# Patient Record
Sex: Female | Born: 2000 | Race: White | Hispanic: No | Marital: Single | Smoking: Never smoker
Health system: Southern US, Community
[De-identification: ages and names within clinical notes are randomized; demographics above are authoritative.]

## PROBLEM LIST (undated history)

## (undated) DIAGNOSIS — F329 Major depressive disorder, single episode, unspecified: Secondary | ICD-10-CM

## (undated) DIAGNOSIS — F909 Attention-deficit hyperactivity disorder, unspecified type: Secondary | ICD-10-CM

## (undated) DIAGNOSIS — F419 Anxiety disorder, unspecified: Secondary | ICD-10-CM

## (undated) DIAGNOSIS — F79 Unspecified intellectual disabilities: Secondary | ICD-10-CM

## (undated) DIAGNOSIS — F4310 Post-traumatic stress disorder, unspecified: Secondary | ICD-10-CM

## (undated) DIAGNOSIS — R569 Unspecified convulsions: Secondary | ICD-10-CM

## (undated) DIAGNOSIS — F32A Depression, unspecified: Secondary | ICD-10-CM

## (undated) DIAGNOSIS — R7303 Prediabetes: Secondary | ICD-10-CM

## (undated) DIAGNOSIS — I959 Hypotension, unspecified: Secondary | ICD-10-CM

---
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
## 2020-02-24 ENCOUNTER — Emergency Department (HOSPITAL_COMMUNITY)
Admission: EM | Admit: 2020-02-24 | Discharge: 2020-02-25 | Disposition: A | Discharge status: Home / Self Care | Payer: Medicaid Other | Attending: Emergency Medicine | Admitting: Emergency Medicine

## 2020-02-24 ENCOUNTER — Encounter (HOSPITAL_COMMUNITY): Payer: Self-pay

## 2020-02-24 ENCOUNTER — Other Ambulatory Visit: Payer: Self-pay

## 2020-02-24 DIAGNOSIS — R45851 Suicidal ideations: Secondary | ICD-10-CM | POA: Insufficient documentation

## 2020-02-24 DIAGNOSIS — F909 Attention-deficit hyperactivity disorder, unspecified type: Secondary | ICD-10-CM | POA: Diagnosis not present

## 2020-02-24 DIAGNOSIS — Z20822 Contact with and (suspected) exposure to covid-19: Secondary | ICD-10-CM | POA: Diagnosis not present

## 2020-02-24 DIAGNOSIS — F332 Major depressive disorder, recurrent severe without psychotic features: Secondary | ICD-10-CM | POA: Diagnosis present

## 2020-02-24 LAB — CBC WITH DIFFERENTIAL/PLATELET
Abs Immature Granulocytes: 0.02 10*3/uL (ref 0.00–0.07)
Basophils Absolute: 0 10*3/uL (ref 0.0–0.1)
Basophils Relative: 1 %
Eosinophils Absolute: 0 10*3/uL (ref 0.0–0.5)
Eosinophils Relative: 1 %
HCT: 39.1 % (ref 36.0–46.0)
Hemoglobin: 12.3 g/dL (ref 12.0–15.0)
Immature Granulocytes: 0 %
Lymphocytes Relative: 26 %
Lymphs Abs: 1.9 10*3/uL (ref 0.7–4.0)
MCH: 27 pg (ref 26.0–34.0)
MCHC: 31.5 g/dL (ref 30.0–36.0)
MCV: 85.9 fL (ref 80.0–100.0)
Monocytes Absolute: 0.4 10*3/uL (ref 0.1–1.0)
Monocytes Relative: 6 %
Neutro Abs: 5 10*3/uL (ref 1.7–7.7)
Neutrophils Relative %: 66 %
Platelets: 288 10*3/uL (ref 150–400)
RBC: 4.55 MIL/uL (ref 3.87–5.11)
RDW: 13.4 % (ref 11.5–15.5)
WBC: 7.5 10*3/uL (ref 4.0–10.5)
nRBC: 0 % (ref 0.0–0.2)

## 2020-02-24 LAB — COMPREHENSIVE METABOLIC PANEL
ALT: 16 U/L (ref 0–44)
AST: 19 U/L (ref 15–41)
Albumin: 4 g/dL (ref 3.5–5.0)
Alkaline Phosphatase: 88 U/L (ref 38–126)
Anion gap: 9 (ref 5–15)
BUN: 8 mg/dL (ref 6–20)
CO2: 25 mmol/L (ref 22–32)
Calcium: 8.6 mg/dL — ABNORMAL LOW (ref 8.9–10.3)
Chloride: 103 mmol/L (ref 98–111)
Creatinine, Ser: 0.76 mg/dL (ref 0.44–1.00)
GFR calc Af Amer: >60 mL/min (ref >60)
GFR calc non Af Amer: >60 mL/min (ref >60)
Glucose, Bld: 118 mg/dL — ABNORMAL HIGH (ref 70–99)
Potassium: 3.8 mmol/L (ref 3.5–5.1)
Sodium: 137 mmol/L (ref 135–145)
Total Bilirubin: 0.3 mg/dL (ref 0.3–1.2)
Total Protein: 7.5 g/dL (ref 6.5–8.1)

## 2020-02-24 LAB — RAPID URINE DRUG SCREEN, HOSP PERFORMED
Amphetamines: NOT DETECTED
Barbiturates: NOT DETECTED
Benzodiazepines: NOT DETECTED
Cocaine: NOT DETECTED
Opiates: NOT DETECTED
Tetrahydrocannabinol: NOT DETECTED

## 2020-02-24 LAB — ETHANOL: Alcohol, Ethyl (B): <10 mg/dL (ref <10)

## 2020-02-24 LAB — HCG, QUANTITATIVE, PREGNANCY: hCG, Beta Chain, Quant, S: <1 m[IU]/mL (ref <5)

## 2020-02-24 MED ORDER — ALBUTEROL SULFATE HFA 108 (90 BASE) MCG/ACT IN AERS: 1.0000 | INHALATION_SPRAY | Freq: Four times a day (QID) | RESPIRATORY_TRACT | Status: DC | PRN

## 2020-02-24 MED ORDER — SERTRALINE HCL 50 MG PO TABS
100.0000 mg | ORAL_TABLET | Freq: Every day | ORAL | Status: DC
  Administered 2020-02-24: 100 mg via ORAL
  Filled 2020-02-24: qty 2

## 2020-02-24 MED ORDER — HYDROXYZINE HCL 25 MG PO TABS
25.0000 mg | ORAL_TABLET | Freq: Once | ORAL | Status: AC
  Administered 2020-02-24: 25 mg via ORAL
  Filled 2020-02-24: qty 1

## 2020-02-24 MED ORDER — ARIPIPRAZOLE 5 MG PO TABS
5.0000 mg | ORAL_TABLET | Freq: Every day | ORAL | Status: DC
  Administered 2020-02-24: 5 mg via ORAL
  Filled 2020-02-24: qty 1

## 2020-02-24 NOTE — ED Notes (Signed)
Pt given gingerale, resting in bed

## 2020-02-24 NOTE — ED Provider Notes (Signed)
[facility] EMERGENCY DEPARTMENT Provider Note   CSN: [id number] Arrival date & time: 02/24/20  2018     History Chief Complaint  Patient presents with  . Suicidal    Mercedes Cardenas is a 19 y.o. female.  Patient was brought in because she has suicidal thoughts  The history is provided by the patient and medical records.  Altered Mental Status Presenting symptoms: behavior changes   Severity:  Moderate Most recent episode:  Today Episode history:  Multiple Timing:  Constant Progression:  Waxing and waning Chronicity:  New Context: not alcohol use   Associated symptoms: no abdominal pain, no hallucinations, no headaches, no rash and no seizures        Past Medical History:  Diagnosis Date  . ADHD   . Anxiety   . Depression   . Intellectual disability   . Major depressive disorder   . Pre-diabetes   . PTSD (post-traumatic stress disorder)     Patient Active Problem List   Diagnosis Date Noted  . Psychosis (HCC)   . Intellectual disability   . Post traumatic stress disorder (PTSD)   . MDD (major depressive disorder) 01/31/2019    History reviewed. No pertinent surgical history.   OB History   No obstetric history on file.     Family History  Problem Relation Age of Onset  . Diabetes Mother   . Hyperlipidemia Mother   . Bipolar disorder Father     Social History   Tobacco Use  . Smoking status: Never Smoker  . Smokeless tobacco: Never Used  Substance Use Topics  . Alcohol use: Never  . Drug use: Never    Home Medications Prior to Admission medications   Medication Sig Start Date End Date Taking? Authorizing Provider  albuterol (VENTOLIN HFA) 108 (90 Base) MCG/ACT inhaler Inhale 1-2 puffs into the lungs every 6 (six) hours as needed for wheezing or shortness of breath.    [provider]  ARIPiprazole (ABILIFY) 5 MG tablet Take 1 tablet (5 mg total) by mouth at bedtime. Patient taking differently: Take 5 mg by mouth in the morning.   02/06/19   Aldean Baker, NP  hydrOXYzine (ATARAX/VISTARIL) 25 MG tablet Take 1 tablet (25 mg total) by mouth 3 (three) times daily as needed for anxiety. Patient taking differently: Take 25 mg by mouth at bedtime.  02/06/19   Aldean Baker, NP  sertraline (ZOLOFT) 100 MG tablet Take 50-100 mg by mouth See admin instructions. 50mg  in the morning and take 100mg  at bedtime 07/11/19   [provider]    Allergies    Patient has no known allergies.  Review of Systems   Review of Systems  Constitutional: Negative for appetite change and fatigue.  HENT: Negative for congestion, ear discharge and sinus pressure.   Eyes: Negative for discharge.  Respiratory: Negative for cough.   Cardiovascular: Negative for chest pain.  Gastrointestinal: Negative for abdominal pain and diarrhea.  Genitourinary: Negative for frequency and hematuria.  Musculoskeletal: Negative for back pain.  Skin: Negative for rash.  Neurological: Negative for seizures and headaches.  Psychiatric/Behavioral: Negative for hallucinations.       Depression and suicidal thoughts    Physical Exam Updated Vital Signs BP (!) 159/122   Pulse 82   Temp 98.3 F (36.8 C) (Oral)   Resp 19   Ht 5\' 6"  (1.676 m)   Wt 90.7 kg   LMP 02/21/2020   SpO2 99%   BMI 32.28 kg/m   Physical  Exam Vitals and nursing note reviewed.  Constitutional:      Appearance: She is well-developed.  HENT:     Head: Normocephalic.     Nose: Nose normal.  Eyes:     General: No scleral icterus.    Conjunctiva/sclera: Conjunctivae normal.  Neck:     Thyroid: No thyromegaly.  Cardiovascular:     Rate and Rhythm: Normal rate and regular rhythm.     Heart sounds: No murmur heard.  No friction rub. No gallop.   Pulmonary:     Breath sounds: No stridor. No wheezing or rales.  Chest:     Chest wall: No tenderness.  Abdominal:     General: There is no distension.     Tenderness: There is no abdominal tenderness. There is no rebound.    Musculoskeletal:        General: Normal range of motion.     Cervical back: Neck supple.  Lymphadenopathy:     Cervical: No cervical adenopathy.  Skin:    Findings: No erythema or rash.  Neurological:     Mental Status: She is alert and oriented to person, place, and time.     Motor: No abnormal muscle tone.     Coordination: Coordination normal.  Psychiatric:        Behavior: Behavior normal.     Comments: Patient not suicidal now according to what he says     ED Results / Procedures / Treatments   Labs (all labs ordered are listed, but only abnormal results are displayed) Labs Reviewed  SARS CORONAVIRUS 2 BY RT PCR (HOSPITAL ORDER, PERFORMED IN [facility])  RAPID URINE DRUG SCREEN, HOSP PERFORMED  CBC WITH DIFFERENTIAL/PLATELET  COMPREHENSIVE METABOLIC PANEL  HCG, QUANTITATIVE, PREGNANCY  ETHANOL    EKG None  Radiology No results found.  Procedures Procedures (including critical care time)  Medications Ordered in ED Medications  sertraline (ZOLOFT) tablet 100 mg (has no administration in time range)  hydrOXYzine (ATARAX/VISTARIL) tablet 25 mg (has no administration in time range)    ED Course  I have reviewed the triage vital signs and the nursing notes.  Pertinent labs & imaging results that were available during my care of the patient were reviewed by me and considered in my medical decision making (see chart for details).    MDM Rules/Calculators/A&P                          Patient with suicidal ideations that possibly have diminished now.  She will be seen by behavioral health       This patient presents to the ED for concern of depression, this involves an extensive number of treatment options, and is a complaint that carries with it a high risk of complications and morbidity.  The differential diagnosis includes suicidal ideation   Lab Tests:   I Ordered, reviewed, and interpreted labs, which included CBC and chemistries  unremarkable  Medicines ordered:   I ordered medication Zoloft Atarax  Imaging Studies ordered:     Additional history obtained:   Additional history obtained from record  Previous records obtained and reviewed.  Consultations Obtained:   I consulted you.  And discussed lab and imaging findings  Reevaluation:  After the interventions stated above, I reevaluated the patient and found unchanged  Critical Interventions:  .   Final Clinical Impression(s) / ED Diagnoses Final diagnoses:  None    Rx / DC Orders ED Discharge Orders  None       Bethann Berkshire, MD 02/26/20 1145

## 2020-02-24 NOTE — ED Triage Notes (Signed)
Pt comes in voluntarily with escort of [employer]. With feelings of being suicidal. Says she has been feeling this way for a while. States that her plan is to just run away and "be gone" called police to bring her because she did not want to worry family.

## 2020-02-24 NOTE — ED Provider Notes (Signed)
11:39 PM Ford, TTS states patient has been accepted at behavioral health by Dr.Maurer, pending her Covid test results.  Nursing staff is getting that done now.  Patient is in her stretcher in the hall.  She was informed of her admission and she is agreeable.   Devoria Albe, MD 02/24/20 [phone]
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
## 2020-02-25 ENCOUNTER — Inpatient Hospital Stay (HOSPITAL_COMMUNITY)
Admission: EM | Admit: 2020-02-25 | Discharge: 2020-02-28 | DRG: 885 | Disposition: A | Discharge status: Home / Self Care | Payer: Medicaid Other | Source: Other Acute Inpatient Hospital | Attending: Psychiatry | Admitting: Psychiatry

## 2020-02-25 ENCOUNTER — Encounter (HOSPITAL_COMMUNITY): Payer: Self-pay

## 2020-02-25 DIAGNOSIS — F4310 Post-traumatic stress disorder, unspecified: Secondary | ICD-10-CM | POA: Diagnosis present

## 2020-02-25 DIAGNOSIS — X788XXA Intentional self-harm by other sharp object, initial encounter: Secondary | ICD-10-CM | POA: Diagnosis present

## 2020-02-25 DIAGNOSIS — K5900 Constipation, unspecified: Secondary | ICD-10-CM | POA: Diagnosis present

## 2020-02-25 DIAGNOSIS — G4700 Insomnia, unspecified: Secondary | ICD-10-CM | POA: Diagnosis present

## 2020-02-25 DIAGNOSIS — F603 Borderline personality disorder: Secondary | ICD-10-CM | POA: Diagnosis present

## 2020-02-25 DIAGNOSIS — Z915 Personal history of self-harm: Secondary | ICD-10-CM | POA: Diagnosis not present

## 2020-02-25 DIAGNOSIS — F79 Unspecified intellectual disabilities: Secondary | ICD-10-CM

## 2020-02-25 DIAGNOSIS — F411 Generalized anxiety disorder: Secondary | ICD-10-CM | POA: Diagnosis present

## 2020-02-25 DIAGNOSIS — F322 Major depressive disorder, single episode, severe without psychotic features: Secondary | ICD-10-CM | POA: Insufficient documentation

## 2020-02-25 DIAGNOSIS — F333 Major depressive disorder, recurrent, severe with psychotic symptoms: Principal | ICD-10-CM | POA: Diagnosis present

## 2020-02-25 DIAGNOSIS — Z20822 Contact with and (suspected) exposure to covid-19: Secondary | ICD-10-CM | POA: Diagnosis present

## 2020-02-25 DIAGNOSIS — R45851 Suicidal ideations: Secondary | ICD-10-CM | POA: Diagnosis present

## 2020-02-25 HISTORY — DX: Hypotension, unspecified: I95.9

## 2020-02-25 HISTORY — DX: Unspecified convulsions: R56.9

## 2020-02-25 LAB — SARS CORONAVIRUS 2 BY RT PCR (HOSPITAL ORDER, PERFORMED IN ~~LOC~~ HOSPITAL LAB): SARS Coronavirus 2: NEGATIVE

## 2020-02-25 MED ORDER — HYDROXYZINE HCL 25 MG PO TABS
25.0000 mg | ORAL_TABLET | Freq: Every day | ORAL | Status: DC
  Administered 2020-02-25 – 2020-02-27 (×3): 25 mg via ORAL
  Filled 2020-02-25 (×6): qty 1

## 2020-02-25 MED ORDER — ARIPIPRAZOLE 5 MG PO TABS
5.0000 mg | ORAL_TABLET | Freq: Every morning | ORAL | Status: DC
  Administered 2020-02-26 – 2020-02-28 (×3): 5 mg via ORAL
  Filled 2020-02-25 (×4): qty 1

## 2020-02-25 MED ORDER — POLYETHYLENE GLYCOL 3350 17 G PO PACK
17.0000 g | PACK | Freq: Every day | ORAL | Status: DC
  Administered 2020-02-25 – 2020-02-27 (×3): 17 g via ORAL
  Filled 2020-02-25 (×5): qty 1

## 2020-02-25 MED ORDER — MAGNESIUM HYDROXIDE 400 MG/5ML PO SUSP
30.0000 mL | Freq: Every day | ORAL | Status: DC | PRN
  Administered 2020-02-25: 30 mL via ORAL
  Filled 2020-02-25: qty 30

## 2020-02-25 MED ORDER — HYDROXYZINE HCL 25 MG PO TABS
25.0000 mg | ORAL_TABLET | Freq: Four times a day (QID) | ORAL | Status: DC | PRN
  Administered 2020-02-25 – 2020-02-28 (×3): 25 mg via ORAL
  Filled 2020-02-25 (×2): qty 1

## 2020-02-25 MED ORDER — ACETAMINOPHEN 325 MG PO TABS: 650.0000 mg | ORAL_TABLET | Freq: Four times a day (QID) | ORAL | Status: DC | PRN

## 2020-02-25 MED ORDER — TRAZODONE HCL 50 MG PO TABS
50.0000 mg | ORAL_TABLET | Freq: Every evening | ORAL | Status: DC | PRN
  Administered 2020-02-25 – 2020-02-27 (×3): 50 mg via ORAL
  Filled 2020-02-25 (×4): qty 1

## 2020-02-25 MED ORDER — ACETAMINOPHEN 325 MG PO TABS
650.0000 mg | ORAL_TABLET | Freq: Four times a day (QID) | ORAL | Status: DC | PRN
  Administered 2020-02-27: 650 mg via ORAL
  Filled 2020-02-25: qty 2

## 2020-02-25 MED ORDER — SERTRALINE HCL 100 MG PO TABS
200.0000 mg | ORAL_TABLET | Freq: Once | ORAL | Status: AC
  Administered 2020-02-25: 200 mg via ORAL
  Filled 2020-02-25: qty 2

## 2020-02-25 MED ORDER — ALUM & MAG HYDROXIDE-SIMETH 200-200-20 MG/5ML PO SUSP
30.0000 mL | ORAL | Status: DC | PRN
  Administered 2020-02-26: 30 mL via ORAL
  Filled 2020-02-25: qty 30

## 2020-02-25 MED ORDER — ARIPIPRAZOLE 5 MG PO TABS
5.0000 mg | ORAL_TABLET | Freq: Once | ORAL | Status: AC
  Administered 2020-02-25: 5 mg via ORAL
  Filled 2020-02-25: qty 1

## 2020-02-25 MED ORDER — ALBUTEROL SULFATE HFA 108 (90 BASE) MCG/ACT IN AERS: 1.0000 | INHALATION_SPRAY | Freq: Four times a day (QID) | RESPIRATORY_TRACT | Status: DC | PRN

## 2020-02-25 MED ORDER — SERTRALINE HCL 100 MG PO TABS
200.0000 mg | ORAL_TABLET | Freq: Every day | ORAL | Status: DC
  Administered 2020-02-26 – 2020-02-27 (×2): 200 mg via ORAL
  Filled 2020-02-25 (×4): qty 2
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
## 2020-02-25 NOTE — Progress Notes (Signed)
   02/25/20 0612  Vital Signs  Temp 98.5 F (36.9 C)  Temp Source Oral  Pulse Rate 84  BP 106/78  BP Method Automatic   D: Patient denies SI/HI/AVH.Patient rated anxiety 3/10 and depression 4/10. Patient slept in, in the morning, but went to the cafeteria at lunch. Patient has some light red lacerations on her upper right arm from cutting. Pt. Stated that she "..did it yesterday." A:   Support and encouragement provided Routine safety checks conducted every 15 minutes. Patient  Informed to notify staff with any concerns.   R:  Safety maintained.

## 2020-02-25 NOTE — BHH Group Notes (Signed)
Date:  02/25/2020 Time:  9:00am-9:45am  Group Topic/Focus: PROGRESSIVE RELAXATION. A group where deep breathing is taught and tensing and relaxation muscle groups is used. Imagery is used as well.  Pts are asked to imagine 3 pillars that hold them up when they are not able to hold themselves up.  Participation Level:  Active  Participation Quality:  Appropriate  Affect:  Appropriate  Cognitive:  Oriented  Insight: Improving  Engagement in Group:  Engaged  Modes of Intervention:  Activity, Discussion, Education, and Support  Additional Comments:  Pt rates her energy at a 4/10 and states her pillars are her sister, nephew and nieces, and God.  Dione Housekeeper 02/25/2020

## 2020-02-25 NOTE — Tx Team (Signed)
Initial Treatment Plan 02/25/2020 4:29 AM [name] VZS:[id number]    PATIENT STRESSORS: Financial difficulties Health problems Marital or family conflict Occupational concerns   PATIENT STRENGTHS: Active sense of humor Motivation for treatment/growth Supportive family/friends   PATIENT IDENTIFIED PROBLEMS: depression  anxiety  Anger/frustration when interacting with 19 year old autistic nephew  Financial stress, hours cut back at her job at [employer]   Stressful job  Self harm behaviors: cutting arm with scissors           DISCHARGE CRITERIA:  Adequate post-discharge living arrangements Improved stabilization in mood, thinking, and/or behavior Medical problems require only outpatient monitoring Verbal commitment to aftercare and medication compliance  PRELIMINARY DISCHARGE PLAN: Outpatient therapy Return to previous living arrangement Return to previous work or school arrangements  PATIENT/FAMILY INVOLVEMENT: This treatment plan has been presented to and reviewed with the patient, Mercedes Cardenas.  The patient and family have been given the opportunity to ask questions and make suggestions.  Ephraim Hamburger, RN 02/25/2020, 4:29 AM
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
## 2020-02-25 NOTE — Progress Notes (Signed)
   02/25/20 2050  Psych Admission Type (Psych Patients Only)  Admission Status Voluntary  Psychosocial Assessment  Patient Complaints Anxiety;Depression  Eye Contact Fair  Facial Expression Flat  Affect Anxious;Appropriate to circumstance  Speech Logical/coherent  Interaction Assertive;Childlike  Motor Activity Other (Comment) (wnl)  Appearance/Hygiene Unremarkable  Behavior Characteristics Cooperative  Mood Depressed;Anxious;Pleasant  Thought Process  Coherency WDL  Content Blaming self  Delusions None reported or observed  Perception WDL  Hallucination None reported or observed  Judgment Poor  Confusion None  Danger to Self  Current suicidal ideation? Denies  Danger to Others  Danger to Others None reported or observed   Pt came to nurse's station because she wanted to talk. Pt stated that she had a dream about being home and everything being alright. "The thing is that when I dream about something it usually comes true like it's a sign to me or something." Pt states that she wants to work on her frustration and anxiety. She lives with her sister who has a 75 year old son who is autistic. The child will bite and hit at times and pt doesn't know how to handle it well. Pt blames herself. She says that her sister will kick her out if she doesn't learn to get along better with the child. Pt wants to learn more about autism and how to handle a child with it. She often babysits him and her 3 nieces who belong to her other sister. Emotional support provided to pt. Also agreed to print off information that she could read about children and autism.  Pt rates anxiety 1/10 and depression 2/10.

## 2020-02-25 NOTE — Progress Notes (Signed)
Date:  02/25/2020 Time:  8:49 PM   Group Topic/Focus:  Goals Group:   The focus of this group is to help patients, review daily goals to achieve during treatment, and discuss how the patient can incorporate goal setting into their daily lives to aide in recovery. This group also aides in reassessing group goals and modifying them in case the goal set was not a good fit.   Participation Level:  Active   Participation Quality:  Appropriate   Affect:  Appropriate   Cognitive:  Appropriate   Insight: Appropriate   Engagement in Group:  Engaged   Modes of Intervention:  Discussion   Additional Comments:  Patient attended goal review and wrap up in Recovery Group and participated. Patient rated today a 4.   Ivry Pigue Foxx 02/25/2020, 8:30 PM
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
## 2020-02-26 LAB — TSH: TSH: 2.84 u[IU]/mL (ref 0.350–4.500)

## 2020-02-26 LAB — LIPID PANEL
Cholesterol: 228 mg/dL — ABNORMAL HIGH (ref 0–200)
HDL: 40 mg/dL — ABNORMAL LOW (ref >40)
LDL Cholesterol: 157 mg/dL — ABNORMAL HIGH (ref 0–99)
Total CHOL/HDL Ratio: 5.7 RATIO
Triglycerides: 157 mg/dL — ABNORMAL HIGH (ref <150)
VLDL: 31 mg/dL (ref 0–40)

## 2020-02-26 NOTE — Progress Notes (Signed)
ADULT GRIEF GROUP NOTE:  Spiritual care group on grief and loss facilitated by chaplain Burnis Kingfisher  Group Goal:  Support / Education around grief and loss Members engage in facilitated group support and psycho-social education.  Group Description:  Following introductions and group rules, group members engaged in facilitated group dialog and support around topic of loss, with particular support around experiences of loss in their lives. Group Identified types of loss (relationships / self / things) and identified patterns, circumstances, and changes that precipitate losses. Reflected on thoughts / feelings around loss, normalized grief responses, and recognized variety in grief experience. Patient Progress:   Arrived at group late.  Was greeted by another group member.  Did not engage in group

## 2020-02-26 NOTE — [facility] Suicide Risk Assessment (Signed)
[facility] INPATIENT:  Family/Significant Other Suicide Prevention Education  Suicide Prevention Education:  Contact Attempts: Johnie Stadel, sister [phone] has been identified by the patient as the family member/significant other with whom the patient will be residing, and identified as the person(s) who will aid the patient in the event of a mental health crisis.  With written consent from the patient, two attempts were made to provide suicide prevention education, prior to and/or following the patient's discharge.  We were unsuccessful in providing suicide prevention education.  A suicide education pamphlet was given to the patient to share with family/significant other.  Date and time of first attempt: 02/26/20 1032am CSW left confidential vm for Ms. Bahr, awaiting response Date and time of second attempt: 2nd attempt needed  Montey Ebel T Zane Samson 02/26/2020, 10:35 AM

## 2020-02-26 NOTE — Progress Notes (Signed)
Recreation Therapy Notes  Date: 8.23.21 Time: 0930 Location: 300 Hall Group Room  Group Topic: Stress Management  Goal Area(s) Addresses:  Patient will identify positive stress management techniques. Patient will identify benefits of using stress management post d/c.  Behavioral Response:  Engaged  Intervention: Stress Management  Activity:  Meditation.  LRT played a meditation that focused on being resilient in the face of adversity.  Patients were to listen and follow along as meditation played.    Education:  Stress Management, Discharge Planning.   Education Outcome: Acknowledges Education  Clinical Observations/Feedback: Pt attended and participated in activity.    Caroll Rancher, LRT/CTRS    Caroll Rancher A 02/26/2020 11:42 AM
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
## 2020-02-26 NOTE — [facility] Group Notes (Signed)
Occupational Therapy Group Note Date: 02/26/2020 Group Topic/Focus: Stages of Change  Group Description: Group encouraged increased engagement and participation through discussion focused on Stages of Change. Education provided on the five stages of stage including pre-contemplation, contemplation, preparation, action, maintenance, and relapse. Patients were encouraged to identify one behavior they would like to change as it relates to their current hospitalization (mental health and/or substance use) and identify and talk through which stage they are currently in and what steps they need to take to get to the next stage of change.  Participation Level: Active   Participation Quality: Independent   Behavior: Alert, Appropriate, Cooperative and Interactive   Speech/Thought Process: Focused   Affect/Mood: Full range   Insight: Fair   Judgement: Fair   Individualization: Mercedes Cardenas was active and independent in her participation of discussion/activity. She shared that she would like to change her behaviors that occur as the result of her anger and anxiety, noting this was the reason for her hospital admission. Pt talked through the stages of change and identified being in stage of 'preparation' and identified her current efforts to come up with discharge plan and identify strategies to better manage her anger and anxiety without becoming rude with sister. Pt appeared somewhat receptive to feedback and education received and identified "listening to music" and "going to the gym" as helpful coping strategies.   Modes of Intervention: Activity, Discussion, Education and Problem-solving  Patient Response to Interventions:  Attentive, Engaged, Receptive and Interested   Plan: Continue to engage patient in OT groups 2 - 3x/week.  02/26/2020  Donne Hazel, MOT, OTR/L

## 2020-02-26 NOTE — Progress Notes (Signed)
   02/26/20 2140  Psych Admission Type (Psych Patients Only)  Admission Status Involuntary  Psychosocial Assessment  Patient Complaints Anxiety;Depression  Eye Contact Fair  Facial Expression Flat  Affect Anxious  Speech Logical/coherent;Slow  Interaction Assertive;Childlike  Motor Activity Other (Comment) (wnl)  Appearance/Hygiene Unremarkable  Behavior Characteristics Cooperative  Mood Pleasant;Anxious;Depressed  Thought Process  Coherency WDL  Content Blaming self  Delusions None reported or observed  Perception WDL  Hallucination None reported or observed  Judgment Poor  Confusion None  Danger to Self  Current suicidal ideation? Denies  Danger to Others  Danger to Others None reported or observed   Pt states that she had a call from her sister today. Her sister says pt can't live with her anymore and will be moving in with her great grandmother. "I have lived with her before but she doesn't always treat me right." Pt was anxious and panicked after that. Pt rates anxiety 3/10 and depression 7/10. Pt also reported stomach upset (indigestion). Emotional support provided to pt.

## 2020-02-26 NOTE — Tx Team (Signed)
Interdisciplinary Treatment and Diagnostic Plan Update  02/26/2020 Time of Session: 10:00am Mercedes Cardenas MRN: [id number]  Principal Diagnosis: MDD (major depressive disorder), recurrent, severe, with psychosis ([place])  Secondary Diagnoses: Principal Problem:   MDD (major depressive disorder), recurrent, severe, with psychosis ([place]) Active Problems:   Intellectual disability   Post traumatic stress disorder (PTSD)   Constipation   Anxiety state   Current Medications:  Current Facility-Administered Medications  Medication Dose Route Frequency Provider Last Rate Last Admin  . acetaminophen (TYLENOL) tablet 650 mg  650 mg Oral Q6H PRN Lavella Hammock, MD      . albuterol (VENTOLIN HFA) 108 (90 Base) MCG/ACT inhaler 1-2 puff  1-2 puff Inhalation Q6H PRN Lavella Hammock, MD      . alum & mag hydroxide-simeth (MAALOX/MYLANTA) 200-200-20 MG/5ML suspension 30 mL  30 mL Oral Q4H PRN Caroline Sauger, NP      . ARIPiprazole (ABILIFY) tablet 5 mg  5 mg Oral q AM Lavella Hammock, MD   5 mg at 02/26/20 1308  . hydrOXYzine (ATARAX/VISTARIL) tablet 25 mg  25 mg Oral Q6H PRN Caroline Sauger, NP   25 mg at 02/25/20 6578  . hydrOXYzine (ATARAX/VISTARIL) tablet 25 mg  25 mg Oral QHS Lavella Hammock, MD   25 mg at 02/25/20 2130  . magnesium hydroxide (MILK OF MAGNESIA) suspension 30 mL  30 mL Oral Daily PRN Caroline Sauger, NP   30 mL at 02/25/20 2134  . polyethylene glycol (MIRALAX / GLYCOLAX) packet 17 g  17 g Oral Daily Dagar, Anjali, MD   17 g at 02/26/20 0927  . sertraline (ZOLOFT) tablet 200 mg  200 mg Oral QHS Dagar, Anjali, MD      . traZODone (DESYREL) tablet 50 mg  50 mg Oral QHS PRN Caroline Sauger, NP   50 mg at 02/25/20 2130   PTA Medications: Medications Prior to Admission  Medication Sig Dispense Refill Last Dose  . acetaminophen (TYLENOL) 325 MG tablet Take 650 mg by mouth every 6 (six) hours as needed for mild pain or headache.     . albuterol (VENTOLIN HFA) 108  (90 Base) MCG/ACT inhaler Inhale 1-2 puffs into the lungs every 6 (six) hours as needed for wheezing or shortness of breath.     . ARIPiprazole (ABILIFY) 5 MG tablet Take 1 tablet (5 mg total) by mouth at bedtime. (Patient taking differently: Take 5 mg by mouth in the morning. ) 30 tablet 0   . hydrOXYzine (ATARAX/VISTARIL) 25 MG tablet Take 1 tablet (25 mg total) by mouth 3 (three) times daily as needed for anxiety. (Patient taking differently: Take 25 mg by mouth at bedtime. ) 30 tablet 0   . sertraline (ZOLOFT) 100 MG tablet Take 50-100 mg by mouth See admin instructions. 21m in the morning and take 1047mat bedtime       Patient Stressors: Financial difficulties Health problems Marital or family conflict Occupational concerns  Patient Strengths: Active sense of humor Motivation for treatment/growth Supportive family/friends  Treatment Modalities: Medication Management, Group therapy, Case management,  1 to 1 session with clinician, Psychoeducation, Recreational therapy.   Physician Treatment Plan for Primary Diagnosis: MDD (major depressive disorder), recurrent, severe, with psychosis [place]): Improvement in symptoms so as ready for discharge Improvement in symptoms so as ready for discharge   Short Term Goals: Ability to identify changes in lifestyle to reduce recurrence of condition will improve Ability to verbalize feelings will improve Ability to disclose and discuss suicidal  ideas Ability to demonstrate self-control will improve Ability to identify and develop effective coping behaviors will improve Ability to maintain clinical measurements within normal limits will improve Ability to identify changes in lifestyle to reduce recurrence of condition will improve Ability to verbalize feelings will improve Ability to disclose and discuss suicidal ideas Ability to demonstrate self-control will improve Ability to identify and develop effective coping behaviors  will improve Ability to maintain clinical measurements within normal limits will improve  Medication Management: Evaluate patient's response, side effects, and tolerance of medication regimen.  Therapeutic Interventions: 1 to 1 sessions, Unit Group sessions and Medication administration.  Evaluation of Outcomes: Not Met  Physician Treatment Plan for Secondary Diagnosis: Principal Problem:   MDD (major depressive disorder), recurrent, severe, with psychosis ([place]) Active Problems:   Intellectual disability   Post traumatic stress disorder (PTSD)   Constipation   Anxiety state  Long Term Goal(s): Improvement in symptoms so as ready for discharge Improvement in symptoms so as ready for discharge   Short Term Goals: Ability to identify changes in lifestyle to reduce recurrence of condition will improve Ability to verbalize feelings will improve Ability to disclose and discuss suicidal ideas Ability to demonstrate self-control will improve Ability to identify and develop effective coping behaviors will improve Ability to maintain clinical measurements within normal limits will improve Ability to identify changes in lifestyle to reduce recurrence of condition will improve Ability to verbalize feelings will improve Ability to disclose and discuss suicidal ideas Ability to demonstrate self-control will improve Ability to identify and develop effective coping behaviors will improve Ability to maintain clinical measurements within normal limits will improve     Medication Management: Evaluate patient's response, side effects, and tolerance of medication regimen.  Therapeutic Interventions: 1 to 1 sessions, Unit Group sessions and Medication administration.  Evaluation of Outcomes: Not Met   RN Treatment Plan for Primary Diagnosis: MDD (major depressive disorder), recurrent, severe, with psychosis ([place]) Long Term Goal(s): Knowledge of disease and therapeutic regimen to maintain health  will improve  Short Term Goals: Ability to remain free from injury will improve, Ability to verbalize frustration and anger appropriately will improve, Ability to identify and develop effective coping behaviors will improve and Compliance with prescribed medications will improve  Medication Management: RN will administer medications as ordered by provider, will assess and evaluate patient's response and provide education to patient for prescribed medication. RN will report any adverse and/or side effects to prescribing provider.  Therapeutic Interventions: 1 on 1 counseling sessions, Psychoeducation, Medication administration, Evaluate responses to treatment, Monitor vital signs and CBGs as ordered, Perform/monitor CIWA, COWS, AIMS and Fall Risk screenings as ordered, Perform wound care treatments as ordered.  Evaluation of Outcomes: Not Met   LCSW Treatment Plan for Primary Diagnosis: MDD (major depressive disorder), recurrent, severe, with psychosis ([place]) Long Term Goal(s): Safe transition to appropriate next level of care at discharge, Engage patient in therapeutic group addressing interpersonal concerns.  Short Term Goals: Engage patient in aftercare planning with referrals and resources, Increase social support, Identify triggers associated with mental health/substance abuse issues and Increase skills for wellness and recovery  Therapeutic Interventions: Assess for all discharge needs, 1 to 1 time with Social worker, Explore available resources and support systems, Assess for adequacy in community support network, Educate family and significant other(s) on suicide prevention, Complete Psychosocial Assessment, Interpersonal group therapy.  Evaluation of Outcomes: Not Met   Progress in Treatment: Attending groups: Yes. Participating in groups: Yes. Taking medication as prescribed: Yes.  Toleration medication: Yes. Family/Significant other contact made: No, will contact:  sister. Patient  understands diagnosis: Yes. Discussing patient identified problems/goals with staff: Yes. Medical problems stabilized or resolved: Yes. Denies suicidal/homicidal ideation: Yes. Issues/concerns per patient self-inventory: No.  New problem(s) identified: No, Describe:  none.  New Short Term/Long Term Goal(s): medication stabilization, elimination of SI thoughts, development of comprehensive mental wellness plan.   Patient Goals:  "Anger, depression, and anxiety fixed"   Discharge Plan or Barriers: To continue therapy and medication management with established providers  Reason for Continuation of Hospitalization: Anxiety Depression Medication stabilization Suicidal ideation  Estimated Length of Stay: 3-5 days  Attendees: Patient: Mercedes Cardenas  02/26/2020   Physician: Myles Lipps, MD 02/26/2020   Nursing:  02/26/2020   RN Care Manager: 02/26/2020   Social Worker: Darletta Moll, [place] 02/26/2020   Recreational Therapist:  02/26/2020   Other:  02/26/2020   Other:  02/26/2020   Other: 02/26/2020       Scribe for Treatment Team: Vassie Moselle, LCSW 02/26/2020 10:37 AM

## 2020-02-27 LAB — HEMOGLOBIN A1C
Hgb A1c MFr Bld: 6.1 % — ABNORMAL HIGH (ref 4.8–5.6)
Mean Plasma Glucose: 128 mg/dL

## 2020-02-27 MED ORDER — NICOTINE POLACRILEX 2 MG MT GUM
2.0000 mg | CHEWING_GUM | OROMUCOSAL | Status: DC | PRN
  Administered 2020-02-27 (×2): 2 mg via ORAL
  Filled 2020-02-27: qty 1

## 2020-02-27 NOTE — Progress Notes (Signed)
Patient noted in milieu and day room smiling laughing and socializing with peers. Denies any SI, HI, AVH but endorses depression. Patient eating snacks, watching movies and laughing with peers. Pt able to make needs known.  Encouragement and support provided. Medications given as prescribed. Prn given for sleep. Waiting effectiveness. Patient receptive and remains safe on unit with q 15 min checks.

## 2020-02-27 NOTE — Progress Notes (Signed)
D:  Patient's self inventory sheet, patient has fair sleep, sleep medication helpful.  Fair appetite, low energy level, poor concentration.  Rated depression and anxiety 5, hopeless 1.  Denied withdrawals.  Denied SI.  Physical problems, stomach, worst pain #7 in past 24 hours, head, stomach.  Goal is work on feelings.  No discharge plans. A:  Medications administered per MD orders.  Emotional support and encouragement given patient. R:  Patient denied SI and HI, contracts for safety.  Denied A/V hallucinations.  Denied pain.  Safety maintained with 15 minute checks.

## 2020-02-27 NOTE — [facility] Suicide Risk Assessment (Signed)
[facility] INPATIENT:  Family/Significant Other Suicide Prevention Education  Suicide Prevention Education:  Education Completed; Mercedes Cardenas (Sister)  [phone]   (name of family member/significant other) has been identified by the patient as the family member/significant other with whom the patient will be residing, and identified as the person(s) who will aid the patient in the event of a mental health crisis (suicidal ideations/suicide attempt).  With written consent from the patient, the family member/significant other has been provided the following suicide prevention education, prior to the and/or following the discharge of the patient.  The suicide prevention education provided includes the following:  Suicide risk factors  Suicide prevention and interventions  National Suicide Hotline telephone number  [facility] assessment telephone number  [facility] Emergency Assistance 911  [facility] and/or Residential Mobile Crisis Unit telephone number  Request made of family/significant other to:  Remove weapons (e.g., guns, rifles, knives), all items previously/currently identified as safety concern.    Remove drugs/medications (over-the-counter, prescriptions, illicit drugs), all items previously/currently identified as a safety concern.  The family member/significant other verbalizes understanding of the suicide prevention education information provided.  The family member/significant other agrees to remove the items of safety concern listed above.  Sister explained plan for pt to go to Grandmothers house in [place] area post-discharge. Sister also explained that she would be willing to have pt return to live with her if needed. Sister provides grandmother's [place] phone number [phone]. States that grandmother will likely pick up pt upon discharge.   Tamber Burtch P Adhrit Krenz 02/27/2020, 1:24 PM

## 2020-02-27 NOTE — Plan of Care (Signed)
Nurse discussed anxiety, depression and coping skills with patient.  

## 2020-02-27 NOTE — Progress Notes (Signed)
Patient rated her day as a 6 out of 10 since she was feeling sad due to some family issues. Her goal for tomorrow is to be better organized and to journal.

## 2020-02-27 NOTE — Progress Notes (Signed)
Recreation Therapy Notes  Animal-Assisted Activity (AAA) Program Checklist/Progress Notes Patient Eligibility Criteria Checklist & Daily Group note for Rec Tx Intervention  Date: 8.24.21 Time: 1430 Location: 300 Hall Group Room   AAA/T Program Assumption of Risk Form signed by Patient/ or Parent Legal Guardian  YES  Patient is free of allergies or sever asthma  YES   Patient reports no fear of animals YES   Patient reports no history of cruelty to animals  YES  Patient understands his/her participation is voluntary YES  Patient washes hands before animal contact  YES   Patient washes hands after animal contact  YES   Behavioral Response:  Engaged  Education: Charity fundraiser, Appropriate Animal Interaction   Education Outcome: Acknowledges understanding/In group clarification offered/Needs additional education.   Clinical Observations/Feedback:  Pt attended and participated in activity.   Caroll Rancher, LRT/CTRS         Caroll Rancher A 02/27/2020 3:32 PM

## 2020-02-27 NOTE — Plan of Care (Signed)
  Problem: Education: Goal: Emotional status will improve Outcome: Progressing Goal: Mental status will improve Outcome: Progressing Goal: Verbalization of understanding the information provided will improve Outcome: Progressing   Problem: Activity: Goal: Interest or engagement in activities will improve Outcome: Progressing   Problem: Safety: Goal: Periods of time without injury will increase Outcome: Progressing   

## 2020-02-27 NOTE — [facility] Suicide Risk Assessment (Signed)
[facility] INPATIENT:  Family/Significant Other Suicide Prevention Education  Suicide Prevention Education:  Education Completed; Oretha Ellis [phone],  (name of family member/significant other) has been identified by the patient as the family member/significant other with whom the patient will be residing, and identified as the person(s) who will aid the patient in the event of a mental health crisis (suicidal ideations/suicide attempt).  With written consent from the patient, the family member/significant other has been provided the following suicide prevention education, prior to the and/or following the discharge of the patient.  The suicide prevention education provided includes the following:  Suicide risk factors  Suicide prevention and interventions  National Suicide Hotline telephone number  [facility] assessment telephone number  [facility] Emergency Assistance 911  [facility] and/or Residential Mobile Crisis Unit telephone number  Request made of family/significant other to:  Remove weapons (e.g., guns, rifles, knives), all items previously/currently identified as safety concern.    Remove drugs/medications (over-the-counter, prescriptions, illicit drugs), all items previously/currently identified as a safety concern.  The family member/significant other verbalizes understanding of the suicide prevention education information provided.  The family member/significant other agrees to remove the items of safety concern listed above.  Grandmother confirmed pt would be living with grandma. She explained that pt's brother [place] would need to pick her up upon discharge and then gather her belongings (including bed) from [place]. [place]'s phone number is [phone]. Will need to schedule ahead of time if possible.   Erin Sons 02/27/2020, 2:19 PM

## 2020-02-28 MED ORDER — POLYETHYLENE GLYCOL 3350 17 G PO PACK
17.0000 g | PACK | Freq: Every day | ORAL | 0 refills | Status: AC
Start: 2020-02-29 — End: ?

## 2020-02-28 MED ORDER — TRAZODONE HCL 50 MG PO TABS
50.0000 mg | ORAL_TABLET | Freq: Every evening | ORAL | 0 refills | Status: AC | PRN
Start: 2020-02-28 — End: ?

## 2020-02-28 MED ORDER — HYDROXYZINE HCL 25 MG PO TABS
25.0000 mg | ORAL_TABLET | Freq: Four times a day (QID) | ORAL | 0 refills | Status: AC | PRN
Start: 2020-02-28 — End: ?

## 2020-02-28 MED ORDER — SERTRALINE HCL 100 MG PO TABS
200.0000 mg | ORAL_TABLET | Freq: Every day | ORAL | 0 refills | Status: AC
Start: 2020-02-28 — End: ?

## 2020-02-28 MED ORDER — ARIPIPRAZOLE 5 MG PO TABS: 5.0000 mg | ORAL_TABLET | Freq: Every morning | ORAL | 0 refills | Status: AC

## 2020-02-28 MED ORDER — NICOTINE POLACRILEX 2 MG MT GUM
2.0000 mg | CHEWING_GUM | OROMUCOSAL | 0 refills | Status: AC | PRN
Start: 2020-02-28 — End: ?

## 2020-02-28 NOTE — [facility] Suicide Risk Assessment (Signed)
[facility] Discharge Suicide Risk Assessment   Principal Problem: MDD (major depressive disorder), recurrent, severe, with psychosis ([facility]) Discharge Diagnoses: Principal Problem:   MDD (major depressive disorder), recurrent, severe, with psychosis ([facility]) Active Problems:   Intellectual disability   Post traumatic stress disorder (PTSD)   Constipation   Anxiety state   Total Time spent with patient: 15 minutes  Musculoskeletal: Strength & Muscle Tone: within normal limits Gait & Station: normal Patient leans: N/A  Psychiatric Specialty Exam: Review of Systems  All other systems reviewed and are negative.   Blood pressure 116/82, pulse 98, temperature 98.3 F (36.8 C), temperature source Oral, resp. rate 18, height 5\' 5"  (1.651 m), weight 98.9 kg, last menstrual period 02/21/2020, SpO2 98 %.Body mass index is 36.28 kg/m.  General Appearance: Casual  Eye Contact::  Fair  Speech:  Normal Rate409  Volume:  Decreased  Mood:  Euthymic  Affect:  Constricted  Thought Process:  Goal Directed and Descriptions of Associations: Circumstantial  Orientation:  Full (Time, Place, and Person)  Thought Content:  Negative  Suicidal Thoughts:  No  Homicidal Thoughts:  No  Memory:  Immediate;   Fair Recent;   Fair Remote;   Fair  Judgement:  Intact  Insight:  Lacking  Psychomotor Activity:  Normal  Concentration:  Fair  Recall:  [phone] of Knowledge:Fair  Language: Fair  Akathisia:  Negative  Handed:  Right  AIMS (if indicated):     Assets:  Desire for Improvement Resilience Social Support  Sleep:  Number of Hours: 6.25  Cognition: Impaired,  Mild  ADL's:  Intact   Mental Status Per Nursing Assessment::   On Admission:  Suicidal ideation indicated by patient  Demographic Factors:  Adolescent or young adult, Caucasian, Low socioeconomic status and Unemployed  Loss Factors: NA  Historical Factors: Impulsivity  Risk Reduction Factors:   Living with another person, especially  a relative and Positive social support  Continued Clinical Symptoms:  Depression:   Impulsivity  Cognitive Features That Contribute To Risk:  None    Suicide Risk:  Minimal: No identifiable suicidal ideation.  Patients presenting with no risk factors but with morbid ruminations; may be classified as minimal risk based on the severity of the depressive symptoms   Follow-up Information    [facility]. Go on 03/04/2020.   Why: You have an in person appointment on Monday 03/04/20 at 915am with Dr. 03/06/20. You will then have a virtual therapy appointment with Webb Silversmith at 11am.  Contact information: [address] [place] [phone]              Plan Of Care/Follow-up recommendations:  Activity:  ad lib  [phone], MD 02/28/2020, 9:27 AM
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
## 2020-02-28 NOTE — Progress Notes (Signed)
Discharge Note:  Patient denies SI/HI AVH at this time. Discharge instructions, AVS, prescriptions and transition record gone over with patient. Patient agrees to comply with medication management, follow-up visit, and outpatient therapy. Patient belongings returned to patient. Patient questions and concerns addressed and answered.  Patient ambulatory off unit.  Patient discharged.  

## 2020-03-20 ENCOUNTER — Ambulatory Visit
Admission: EM | Admit: 2020-03-20 | Discharge: 2020-03-20 | Disposition: A | Discharge status: Home / Self Care | Payer: Medicaid Other | Attending: Emergency Medicine | Admitting: Emergency Medicine

## 2020-03-20 ENCOUNTER — Other Ambulatory Visit: Payer: Self-pay

## 2020-03-20 DIAGNOSIS — K029 Dental caries, unspecified: Secondary | ICD-10-CM

## 2020-03-20 MED ORDER — AMOXICILLIN-POT CLAVULANATE 875-125 MG PO TABS: 1.0000 | ORAL_TABLET | Freq: Two times a day (BID) | ORAL | 0 refills | Status: DC

## 2020-03-20 MED ORDER — CHLORHEXIDINE GLUCONATE 0.12 % MT SOLN: 15.0000 mL | Freq: Two times a day (BID) | OROMUCOSAL | 0 refills | Status: AC

## 2020-03-20 NOTE — ED Triage Notes (Signed)
Pt presents with ear ache and sore throat for past 4 days

## 2020-03-20 NOTE — Discharge Instructions (Addendum)
Rest and drink plenty of fluids Prescribed Augmentin Chlorhexidine mouthwash was prescribed/take as directed Take medications as directed and to completion Continue to use OTC ibuprofen and/ or tylenol as needed for pain control Follow up with PCP if symptoms persists Return here or go to the ER if you have any new or worsening symptoms

## 2020-03-20 NOTE — ED Provider Notes (Signed)
[facility]   [id number] 03/20/20 Arrival Time: 1243  Chief Complaint  Patient presents with  . Otalgia     SUBJECTIVE:  History from: patient.  Mercedes Cardenas is a 19 y.o. female who presents to the urgent care with a complaint of earache and tooth ache for the past 4 days.  Denies a precipitating event.  Patient states the pain is constant and achy in character.  Patient has tried OTC medication without relief.  Symptoms are made worse with eating.  Does not see a dentist regularly.  Report similar symptoms in the past.  Denies fever, chills, fatigue, sinus pain, rhinorrhea, ear discharge, sore throat, SOB, wheezing, chest pain, nausea, changes in bowel or bladder habits.    ROS: As per HPI.  All other pertinent ROS negative.      Past Medical History:  Diagnosis Date  . ADHD   . Anxiety   . Depression   . Hypotension   . Intellectual disability   . Major depressive disorder   . Pre-diabetes   . PTSD (post-traumatic stress disorder)   . PTSD (post-traumatic stress disorder)   . Seizures (HCC)    History reviewed. No pertinent surgical history. No Known Allergies No current facility-administered medications on file prior to encounter.   Current Outpatient Medications on File Prior to Encounter  Medication Sig Dispense Refill  . albuterol (VENTOLIN HFA) 108 (90 Base) MCG/ACT inhaler Inhale 1-2 puffs into the lungs every 6 (six) hours as needed for wheezing or shortness of breath.    . ARIPiprazole (ABILIFY) 5 MG tablet Take 1 tablet (5 mg total) by mouth in the morning. For mood control 30 tablet 0  . hydrOXYzine (ATARAX/VISTARIL) 25 MG tablet Take 1 tablet (25 mg total) by mouth every 6 (six) hours as needed. For anxiety/sleep 75 tablet 0  . nicotine polacrilex (NICORETTE) 2 MG gum Take 1 each (2 mg total) by mouth as needed. (May buy from over the the counter): For smoking cessation 100 tablet 0  . polyethylene glycol (MIRALAX / GLYCOLAX) 17 g packet Take 17  g by mouth daily. (May buy from over the counter): For constipation 14 each 0  . sertraline (ZOLOFT) 100 MG tablet Take 2 tablets (200 mg total) by mouth at bedtime. For depression 60 tablet 0  . traZODone (DESYREL) 50 MG tablet Take 1 tablet (50 mg total) by mouth at bedtime as needed for sleep. 30 tablet 0   Social History   Socioeconomic History  . Marital status: Single    Spouse name: Not on file  . Number of children: Not on file  . Years of education: Not on file  . Highest education level: Not on file  Occupational History  . Not on file  Tobacco Use  . Smoking status: Never Smoker  . Smokeless tobacco: Never Used  Vaping Use  . Vaping Use: Never used  Substance and Sexual Activity  . Alcohol use: Never  . Drug use: Never  . Sexual activity: Not Currently  Other Topics Concern  . Not on file  Social History Narrative  . Not on file   Social Determinants of Health   Financial Resource Strain:   . Difficulty of Paying Living Expenses: Not on file  Food Insecurity:   . Worried About Programme researcher, broadcasting/film/video in the Last Year: Not on file  . Ran Out of Food in the Last Year: Not on file  Transportation Needs:   . Lack of Transportation (Medical): Not on  file  . Lack of Transportation (Non-Medical): Not on file  Physical Activity:   . Days of Exercise per Week: Not on file  . Minutes of Exercise per Session: Not on file  Stress:   . Feeling of Stress : Not on file  Social Connections:   . Frequency of Communication with Friends and Family: Not on file  . Frequency of Social Gatherings with Friends and Family: Not on file  . Attends Religious Services: Not on file  . Active Member of Clubs or Organizations: Not on file  . Attends Banker Meetings: Not on file  . Marital Status: Not on file  Intimate Partner Violence:   . Fear of Current or Ex-Partner: Not on file  . Emotionally Abused: Not on file  . Physically Abused: Not on file  . Sexually Abused: Not  on file   Family History  Problem Relation Age of Onset  . Diabetes Mother   . Hyperlipidemia Mother   . Bipolar disorder Father     OBJECTIVE:  Vitals:   03/20/20 1340  BP: 108/74  Pulse: 81  Resp: 16  Temp: 98.6 F (37 C)  SpO2: 99%     Physical Exam Vitals and nursing note reviewed.  Constitutional:      General: She is not in acute distress.    Appearance: Normal appearance. She is normal weight. She is not ill-appearing, toxic-appearing or diaphoretic.  HENT:     Head: Normocephalic.     Right Ear: Tympanic membrane, ear canal and external ear normal. There is no impacted cerumen.     Left Ear: Tympanic membrane, ear canal and external ear normal. There is no impacted cerumen.     Mouth/Throat:     Lips: Pink.     Dentition: Abnormal dentition. Dental caries present.      Comments: Dental caries present Cardiovascular:     Rate and Rhythm: Normal rate and regular rhythm.     Pulses: Normal pulses.     Heart sounds: Normal heart sounds. No murmur heard.  No friction rub. No gallop.   Pulmonary:     Effort: Pulmonary effort is normal. No respiratory distress.     Breath sounds: Normal breath sounds. No stridor. No wheezing, rhonchi or rales.  Chest:     Chest wall: No tenderness.  Neurological:     Mental Status: She is alert and oriented to person, place, and time.    Imaging: No results found.   ASSESSMENT & PLAN:  1. Dental caries     Meds ordered this encounter  Medications  . amoxicillin-clavulanate (AUGMENTIN) 875-125 MG tablet    Sig: Take 1 tablet by mouth every 12 (twelve) hours.    Dispense:  14 tablet    Refill:  0  . chlorhexidine (PERIDEX) 0.12 % solution    Sig: Use as directed 15 mLs in the mouth or throat 2 (two) times daily.    Dispense:  120 mL    Refill:  0   Discharge instructions  Rest and drink plenty of fluids Prescribed Augmentin Chlorhexidine mouthwash was prescribed/take as directed Take medications as directed and  to completion Continue to use OTC ibuprofen and/ or tylenol as needed for pain control Follow up with PCP if symptoms persists Return here or go to the ER if you have any new or worsening symptoms   Reviewed expectations re: course of current medical issues. Questions answered. Outlined signs and symptoms indicating need for more acute intervention. Patient verbalized  understanding. After Visit Summary given.      Note: This document was prepared using Dragon voice recognition software and may include unintentional dictation errors.    Durward Parcel, FNP 03/20/20 1408

## 2020-10-08 ENCOUNTER — Ambulatory Visit
Admission: EM | Admit: 2020-10-08 | Discharge: 2020-10-08 | Disposition: A | Discharge status: Home / Self Care | Payer: Medicaid Other | Attending: Internal Medicine | Admitting: Internal Medicine

## 2020-10-08 ENCOUNTER — Encounter: Payer: Self-pay | Admitting: Emergency Medicine

## 2020-10-08 ENCOUNTER — Other Ambulatory Visit: Payer: Self-pay

## 2020-10-08 ENCOUNTER — Telehealth: Payer: Self-pay | Admitting: Internal Medicine

## 2020-10-08 DIAGNOSIS — R739 Hyperglycemia, unspecified: Secondary | ICD-10-CM

## 2020-10-08 DIAGNOSIS — K0889 Other specified disorders of teeth and supporting structures: Secondary | ICD-10-CM | POA: Diagnosis not present

## 2020-10-08 DIAGNOSIS — J0100 Acute maxillary sinusitis, unspecified: Secondary | ICD-10-CM

## 2020-10-08 DIAGNOSIS — R399 Unspecified symptoms and signs involving the genitourinary system: Secondary | ICD-10-CM | POA: Diagnosis not present

## 2020-10-08 LAB — POCT URINALYSIS DIP (MANUAL ENTRY)
Bilirubin, UA: NEGATIVE
Blood, UA: NEGATIVE
Glucose, UA: NEGATIVE mg/dL
Ketones, POC UA: NEGATIVE mg/dL
Leukocytes, UA: NEGATIVE
Nitrite, UA: NEGATIVE
Protein Ur, POC: 30 mg/dL — AB
Spec Grav, UA: >=1.03 — AB (ref 1.010–1.025)
Urobilinogen, UA: 1 E.U./dL
pH, UA: 6 (ref 5.0–8.0)

## 2020-10-08 LAB — POCT FASTING CBG KUC MANUAL ENTRY: POCT Glucose (KUC): 183 mg/dL — AB (ref 70–99)

## 2020-10-08 MED ORDER — FLUCONAZOLE 150 MG PO TABS: 150.0000 mg | ORAL_TABLET | Freq: Every day | ORAL | 0 refills | Status: AC

## 2020-10-08 MED ORDER — FLUTICASONE PROPIONATE 50 MCG/ACT NA SUSP: 2.0000 | Freq: Every day | NASAL | 0 refills | Status: AC

## 2020-10-08 MED ORDER — AMOXICILLIN-POT CLAVULANATE 875-125 MG PO TABS: 1.0000 | ORAL_TABLET | Freq: Two times a day (BID) | ORAL | 0 refills | Status: AC

## 2020-10-08 NOTE — ED Triage Notes (Signed)
Sinus pain and ear pain x 4days . Now reports toothache on RT lower side.

## 2020-10-08 NOTE — ED Provider Notes (Signed)
[facility]    CSN: [id number] Arrival date & time: 10/08/20  0820      History   Chief Complaint No chief complaint on file.   HPI Mercedes Cardenas is a 20 y.o. female who presents with her older sister with onset of stuffy nose and HA 4 days, allergy type symptoms but not been taking any meds. Has not been taking any antihistamines for current symptoms. Has not had a fever since was not tested or palpated by her sister whom she is staying with temporarely, but pt has been chilling and having body aches. Has mild cough. Appetite is a little less than her normal. Denies sinus surgeries in the past. She is prone to ear infections. Her sister did an at home covid test and was negative.   2- R lower tooth pain on molar region since yesterday wjhich has a cavity. Sister is planning dental care soon.     Past Medical History:  Diagnosis Date  . ADHD   . Anxiety   . Depression   . Hypotension   . Intellectual disability   . Major depressive disorder   . Pre-diabetes   . PTSD (post-traumatic stress disorder)   . PTSD (post-traumatic stress disorder)   . Seizures [facility])     Patient Active Problem List   Diagnosis Date Noted  . MDD (major depressive disorder), severe (HCC) 02/25/2020  . MDD (major depressive disorder), recurrent, severe, with psychosis (HCC) 02/25/2020  . Constipation 02/25/2020  . Anxiety state 02/25/2020  . Psychosis (HCC)   . Intellectual disability   . Post traumatic stress disorder (PTSD)   . MDD (major depressive disorder) 01/31/2019    History reviewed. No pertinent surgical history.  OB History   No obstetric history on file.      Home Medications    Prior to Admission medications   Medication Sig Start Date End Date Taking? Authorizing Provider  amoxicillin-clavulanate (AUGMENTIN) 875-125 MG tablet Take 1 tablet by mouth every 12 (twelve) hours. 10/08/20  Yes Rodriguez-Southworth, Nettie Elm, PA-C  fluticasone (FLONASE) 50 MCG/ACT nasal  spray Place 2 sprays into both nostrils daily. 10/08/20  Yes Rodriguez-Southworth, Nettie Elm, PA-C  albuterol (VENTOLIN HFA) 108 (90 Base) MCG/ACT inhaler Inhale 1-2 puffs into the lungs every 6 (six) hours as needed for wheezing or shortness of breath.    [provider]  ARIPiprazole (ABILIFY) 5 MG tablet Take 1 tablet (5 mg total) by mouth in the morning. For mood control 02/29/20   Armandina Stammer I, NP  chlorhexidine (PERIDEX) 0.12 % solution Use as directed 15 mLs in the mouth or throat 2 (two) times daily. 03/20/20   Avegno, Zachery Dakins, FNP  fluconazole (DIFLUCAN) 150 MG tablet Take 1 tablet (150 mg total) by mouth daily. Take one after antibiotic use if yeast symptoms, may repeat in 3 days if not better 10/08/20   Rodriguez-Southworth, Nettie Elm, PA-C  hydrOXYzine (ATARAX/VISTARIL) 25 MG tablet Take 1 tablet (25 mg total) by mouth every 6 (six) hours as needed. For anxiety/sleep 02/28/20   Armandina Stammer I, NP  nicotine polacrilex (NICORETTE) 2 MG gum Take 1 each (2 mg total) by mouth as needed. (May buy from over the the counter): For smoking cessation 02/28/20   Armandina Stammer I, NP  polyethylene glycol (MIRALAX / GLYCOLAX) 17 g packet Take 17 g by mouth daily. (May buy from over the counter): For constipation 02/29/20   Armandina Stammer I, NP  sertraline (ZOLOFT) 100 MG tablet Take 2 tablets (200 mg total)  by mouth at bedtime. For depression 02/28/20   Armandina Stammer I, NP  traZODone (DESYREL) 50 MG tablet Take 1 tablet (50 mg total) by mouth at bedtime as needed for sleep. 02/28/20   Sanjuana Kava, NP    Family History Family History  Problem Relation Age of Onset  . Diabetes Mother   . Hyperlipidemia Mother   . Bipolar disorder Father     Social History Social History   Tobacco Use  . Smoking status: Never Smoker  . Smokeless tobacco: Never Used  Vaping Use  . Vaping Use: Never used  Substance Use Topics  . Alcohol use: Never  . Drug use: Never     Allergies   Patient has no known  allergies.   Review of Systems Review of Systems  Constitutional: Positive for activity change, appetite change, chills and fatigue. Negative for diaphoresis and fever.       Has been lethargic  HENT: Positive for congestion, dental problem, ear discharge, sinus pressure and sneezing. Negative for ear pain, sore throat and trouble swallowing.        R>L  Eyes: Negative for discharge.  Respiratory: Positive for cough. Negative for chest tightness.   Cardiovascular: Negative for chest pain.  Gastrointestinal: Positive for diarrhea. Negative for abdominal pain, nausea and vomiting.       Had watery stools x 2 last week   Endocrine:       She is prediabetic and is on Metformin, but is not compliant with diet  Genitourinary: Negative for difficulty urinating, dysuria and frequency.       Had UTI last week and sister is not sure it resolved.   Musculoskeletal: Negative for gait problem and myalgias.  Skin: Negative for rash.  Neurological: Negative for headaches.  Hematological: Positive for adenopathy.     Physical Exam Triage Vital Signs ED Triage Vitals [10/08/20 0835]  Enc Vitals Group     BP 119/77     Pulse Rate (!) 119     Resp 19     Temp 97.7 F (36.5 C)     Temp Source Oral     SpO2 96 %     Weight      Height      Head Circumference      Peak Flow      Pain Score 8     Pain Loc      Pain Edu?      Excl. in GC?    No data found.  Updated Vital Signs BP 119/77 (BP Location: Right Arm)   Pulse (!) 119   Temp 97.7 F (36.5 C) (Oral)   Resp 19   SpO2 96%   Visual Acuity Right Eye Distance:   Left Eye Distance:   Bilateral Distance:    Right Eye Near:   Left Eye Near:    Bilateral Near:     Physical Exam Vitals and nursing note reviewed.  Constitutional:      General: She is not in acute distress.    Appearance: She is obese. She is not toxic-appearing.  HENT:     Head: Normocephalic.     Right Ear: Tympanic membrane, ear canal and external ear  normal.     Left Ear: Tympanic membrane, ear canal and external ear normal.     Nose: Congestion present.     Comments: Has tenderness on L maxillary and R frontal sinus. Has decreased transillumination on R maxillary sinus    Mouth/Throat:  Mouth: Mucous membranes are moist.     Comments: Has a cavity on R lower molar where is tender to tapping, but gum is not red or has signs of abscess Eyes:     General: No scleral icterus.    Conjunctiva/sclera: Conjunctivae normal.  Cardiovascular:     Rate and Rhythm: Regular rhythm. Tachycardia present.  Pulmonary:     Effort: Pulmonary effort is normal.  Musculoskeletal:        General: Normal range of motion.     Cervical back: Neck supple.  Lymphadenopathy:     Cervical: No cervical adenopathy.  Skin:    General: Skin is warm and dry.     Findings: No rash.  Neurological:     Mental Status: She is alert and oriented to person, place, and time.     Gait: Gait normal.     Comments: Speech is slow and answers questions with few seconds delay  Psychiatric:        Mood and Affect: Mood normal.        Behavior: Behavior normal.        Thought Content: Thought content normal.        Judgment: Judgment normal.     UC Treatments / Results  Labs (all labs ordered are listed, but only abnormal results are displayed) Labs Reviewed  POCT URINALYSIS DIP (MANUAL ENTRY) - Abnormal; Notable for the following components:      Result Value   Clarity, UA hazy (*)    Spec Grav, UA >=1.030 (*)    Protein Ur, POC =30 (*)    All other components within normal limits  POCT FASTING CBG [facility] MANUAL ENTRY - Abnormal; Notable for the following components:   POCT Glucose ([facility]) 183 (*)    All other components within normal limits    EKG   Radiology No results found.  Procedures Procedures (including critical care time)  Medications Ordered in UC Medications - No data to display  Initial Impression / Assessment and Plan / UC Course  I have  reviewed the triage vital signs and the nursing notes. Pertinent labs results that were available during my care of the patient were reviewed by me and considered in my medical decision making (see chart for details). Has maxillary sinusitis and resolved UTI. Also has hyperglycemia( ate 1h ago) May also be getting dental infection. I placed her on Augmentin as noted. Needs to Fu with PCP and dentist as planned by her sister.    Final Clinical Impressions(s) / UC Diagnoses   Final diagnoses:  Acute non-recurrent maxillary sinusitis  Pain, dental   Discharge Instructions   None    ED Prescriptions    Medication Sig Dispense Auth. Provider   amoxicillin-clavulanate (AUGMENTIN) 875-125 MG tablet Take 1 tablet by mouth every 12 (twelve) hours. 14 tablet Rodriguez-Southworth, Britany Callicott, PA-C   fluticasone (FLONASE) 50 MCG/ACT nasal spray Place 2 sprays into both nostrils daily. 18.2 g Rodriguez-Southworth, Nettie Elm, PA-C     PDMP not reviewed this encounter.   Garey Ham, PA-C 10/08/20 1503

## 2020-10-08 NOTE — Telephone Encounter (Signed)
I realized after writing her notes, I forgo to send diflucan in case she gets yeast

## 2021-08-21 IMAGING — CT CT CERVICAL SPINE W/O CM
3 of 4 series · 13 of 33 positions shown, 16 images · non-contrast
Comparison: None.

CLINICAL DATA: C-spine trauma.

EXAM:
CT CERVICAL SPINE WITHOUT CONTRAST
TECHNIQUE: Multidetector CT imaging of the cervical spine was performed without
intravenous contrast. Multiplanar CT image reconstructions were also
generated.

[Series 4: sagittal bone · sagittal · 0.27mm/px · 5 of 66 slices shown, 6 images]
[im 22/66  bone]
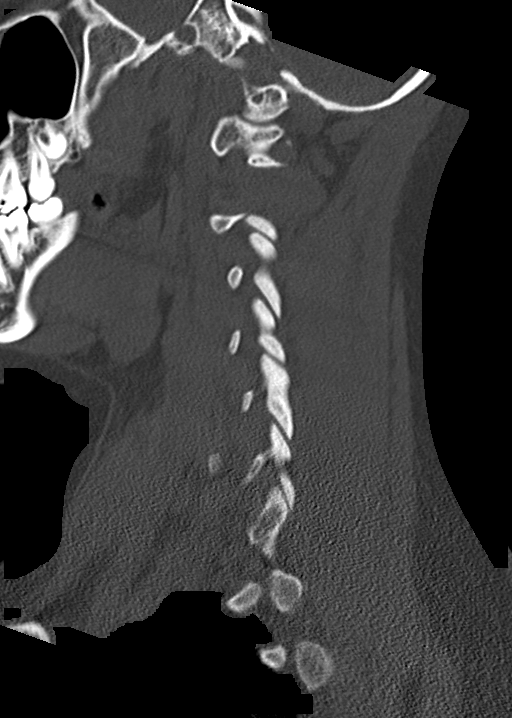
[im 28/66  bone]
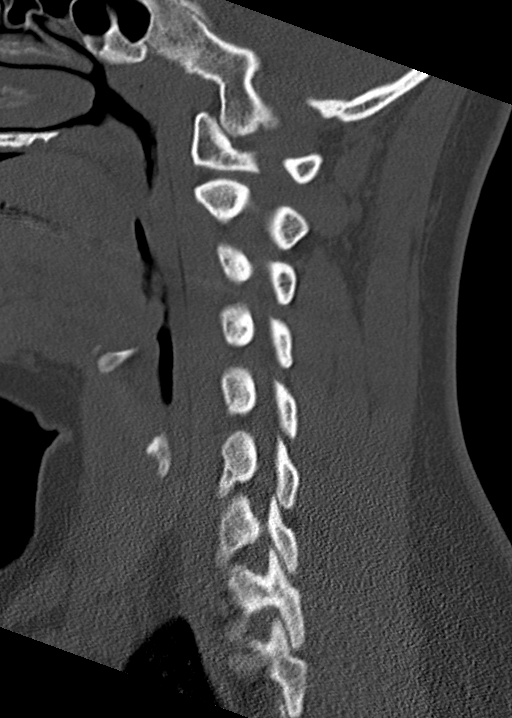
[im 33/66  soft-tissue]
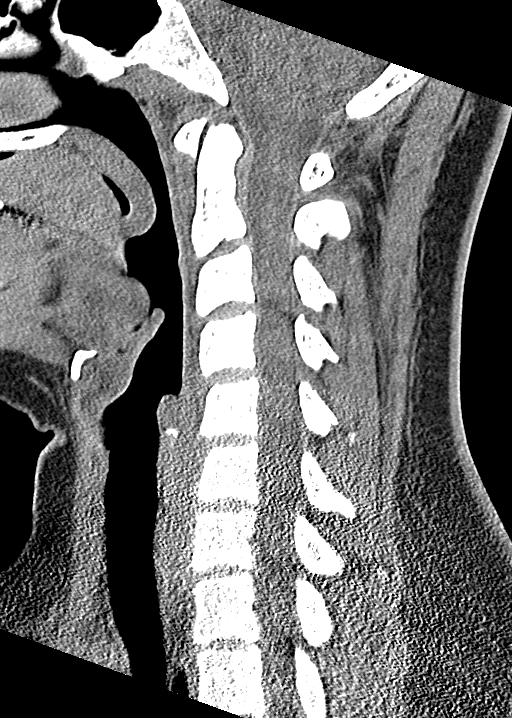
[im 33/66  bone]
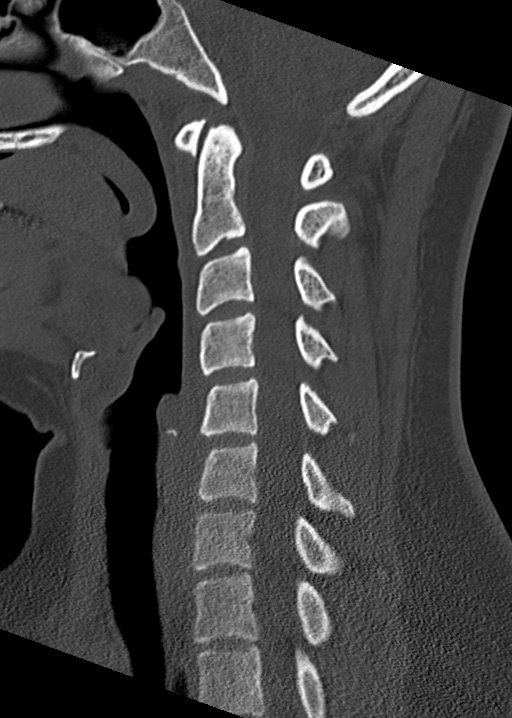
[im 38/66  bone]
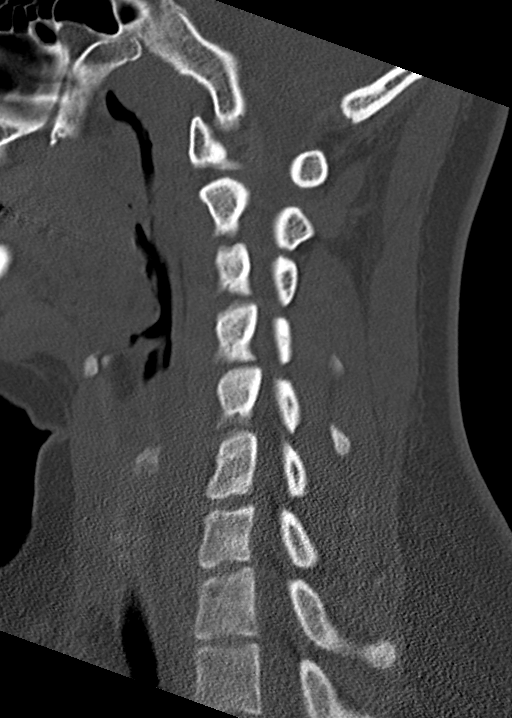
[im 44/66  bone]
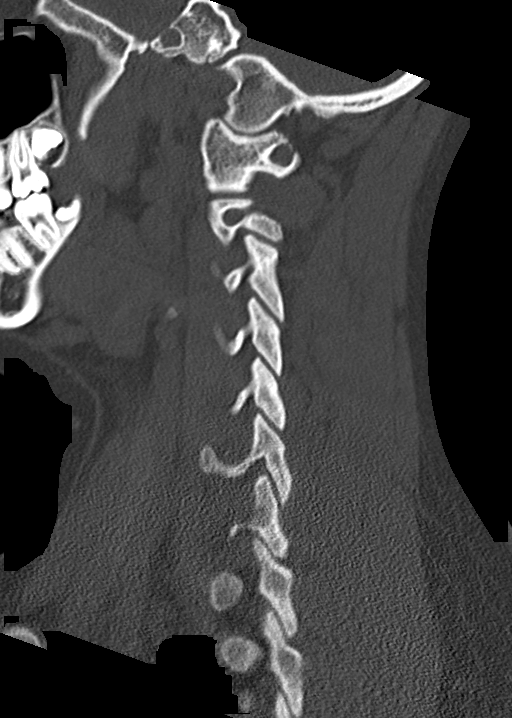

[Series 5: coronal bone · coronal · 0.26mm/px · 3 of 69 slices shown]
[im 14/69  bone]
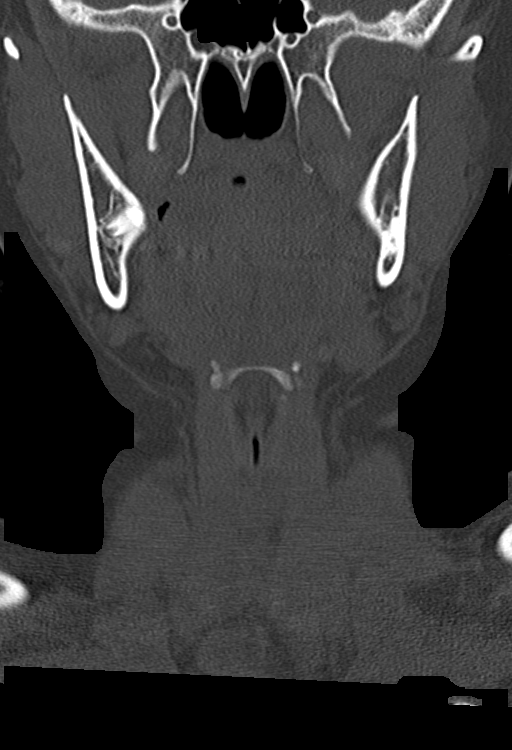
[im 28/69  bone]
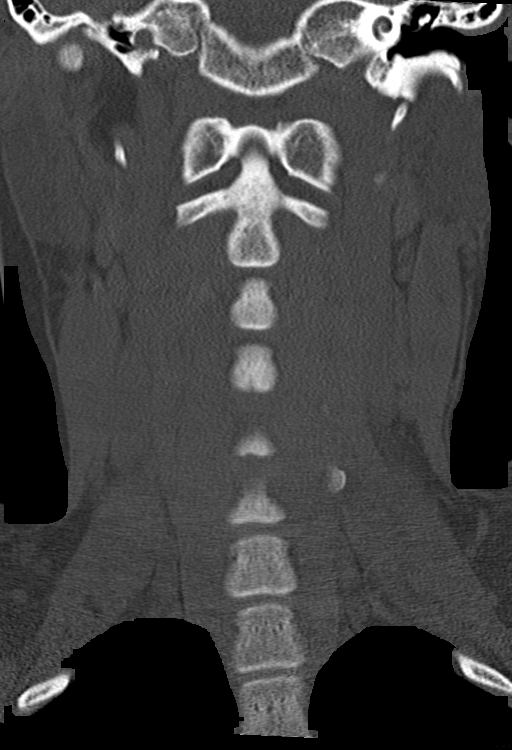
[im 41/69  bone]
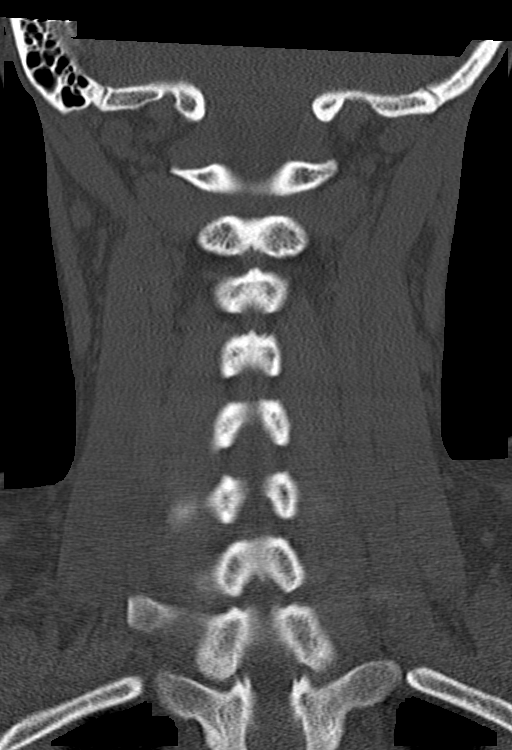

[Series 9: orthogonal (id) · axial · 0.26mm/px · z∈[-305,-185]mm · 5 of 97 slices shown, 7 images]
[im 17/97  soft-tissue]
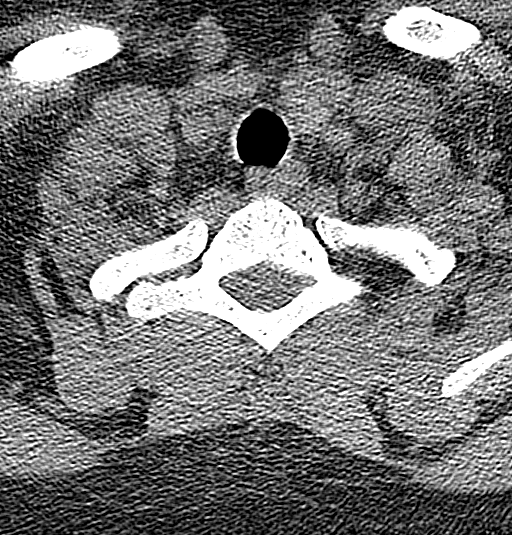
[im 17/97  bone]
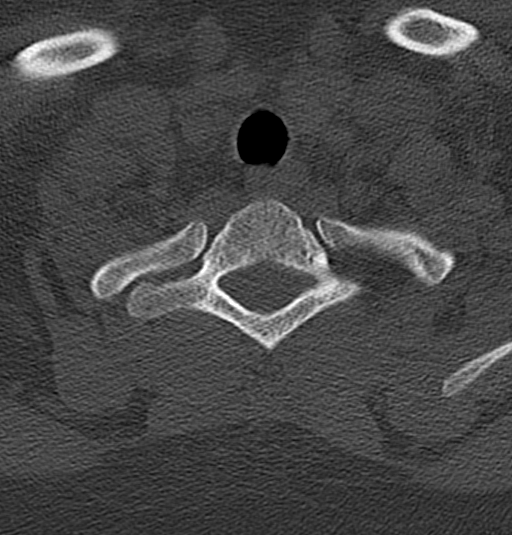
[im 33/97  bone]
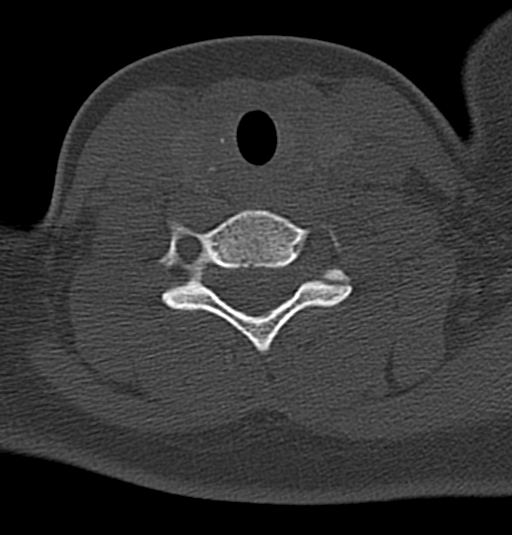
[im 49/97  bone]
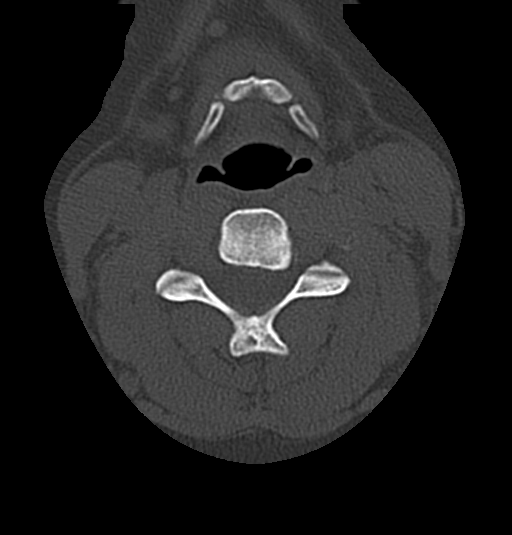
[im 65/97  bone]
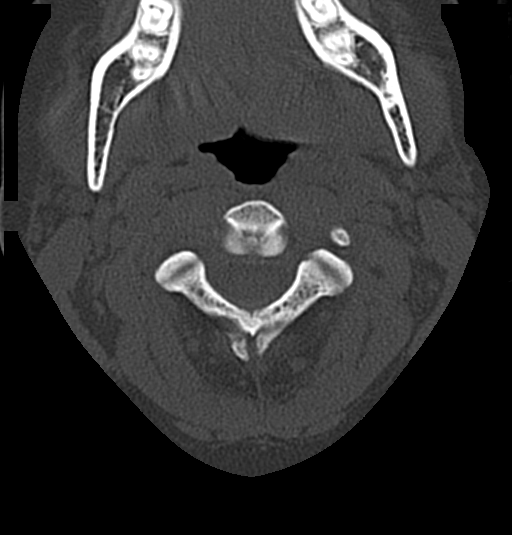
[im 81/97  soft-tissue]
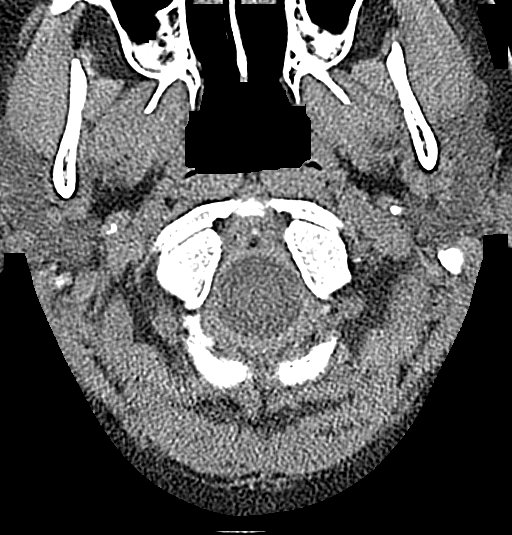
[im 81/97  bone]
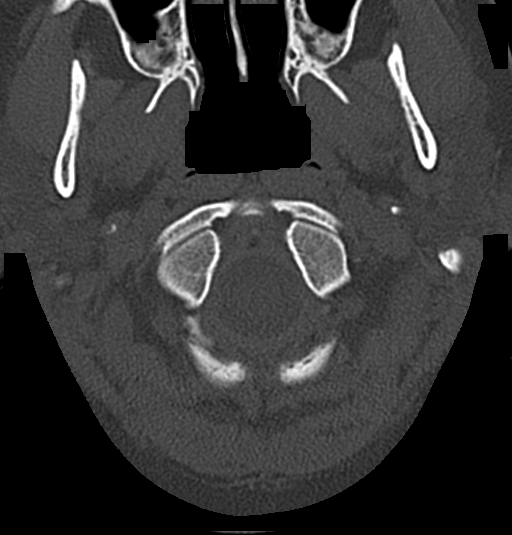

[13 of 33 positions shown; findings below may reference images not displayed]

FINDINGS: Alignment: Normal.

Skull base and vertebrae: No acute fracture. No primary bone lesion
or focal pathologic process.

Soft tissues and spinal canal: No prevertebral fluid or swelling. No
visible canal hematoma.

Disc levels:  Normal

Upper chest: Negative.

Other: None.
IMPRESSION: No evidence of acute traumatic injury to the cervical spine.

## 2021-08-21 IMAGING — CT CT HEAD W/O CM
3 series · 15 of 45 positions shown, 18 images · non-contrast
Comparison: None.

CLINICAL DATA: Head trauma.

EXAM:
CT HEAD WITHOUT CONTRAST
TECHNIQUE: Contiguous axial images were obtained from the base of the skull
through the vertex without intravenous contrast.

[Series 2: head wo · axial · 0.47mm/px · z∈[-137,-22]mm · 9 of 28 slices shown, 12 images]
[im 3/28  brain]
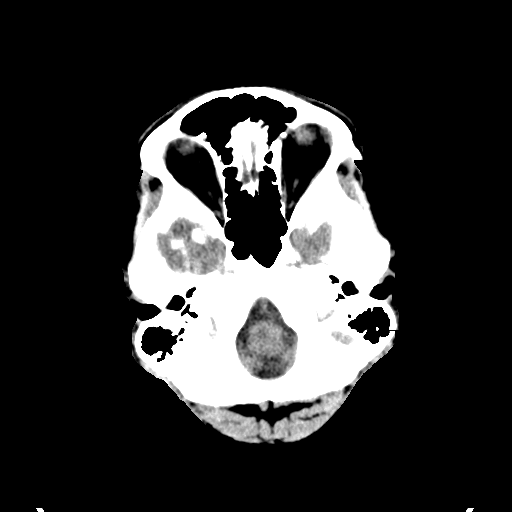
[im 3/28  bone]
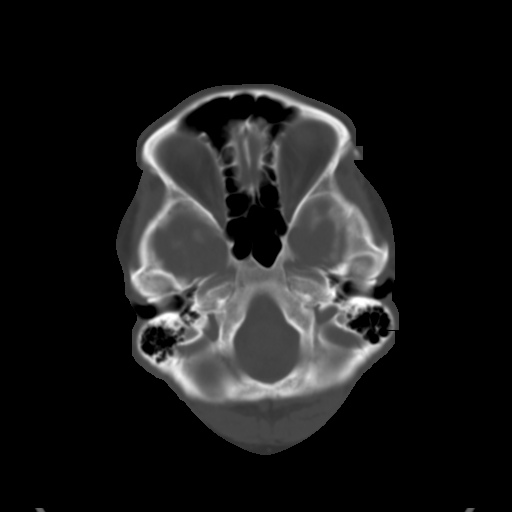
[im 6/28  brain]
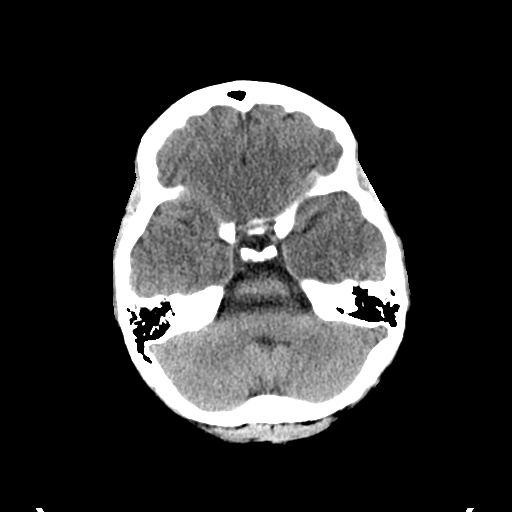
[im 9/28  brain]
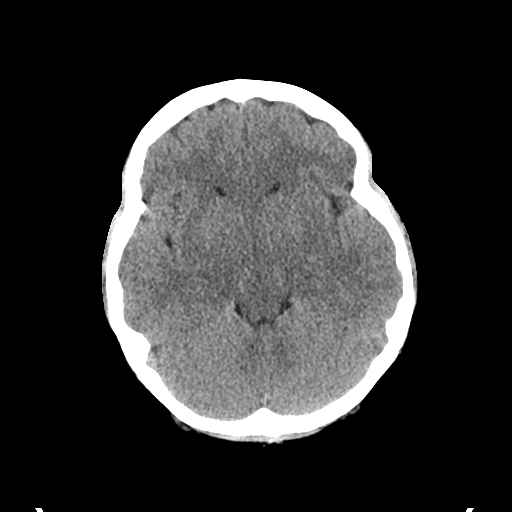
[im 12/28  brain]
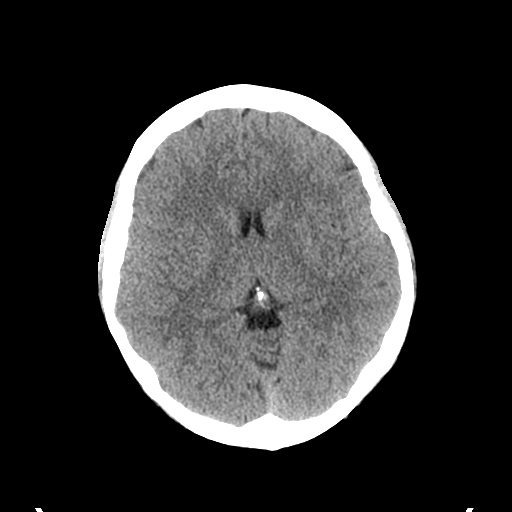
[im 15/28  brain]
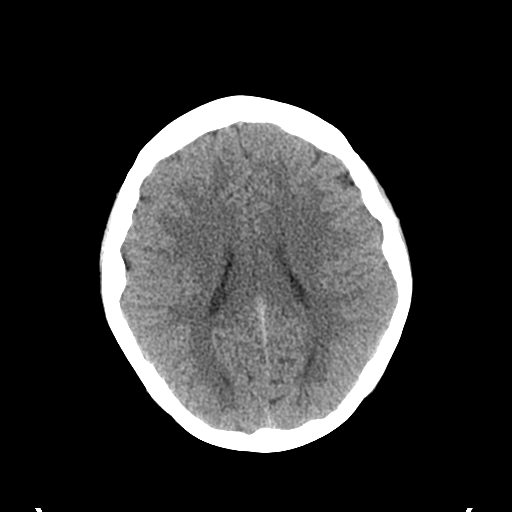
[im 15/28  bone]
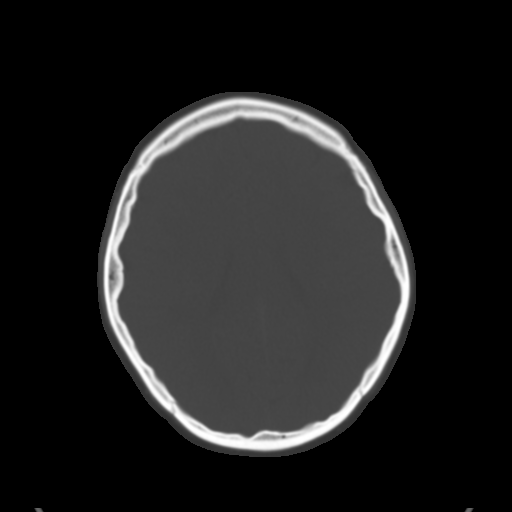
[im 17/28  brain]
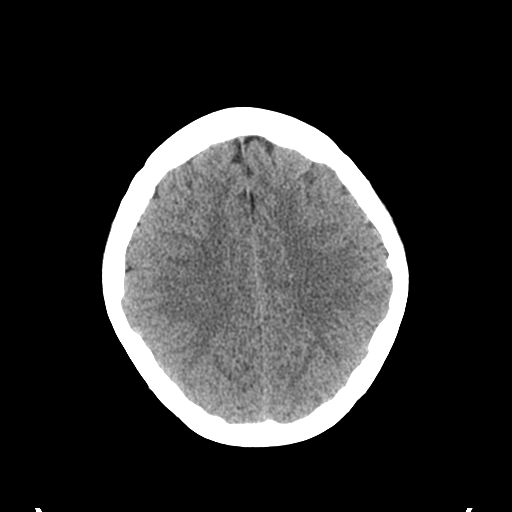
[im 20/28  brain]
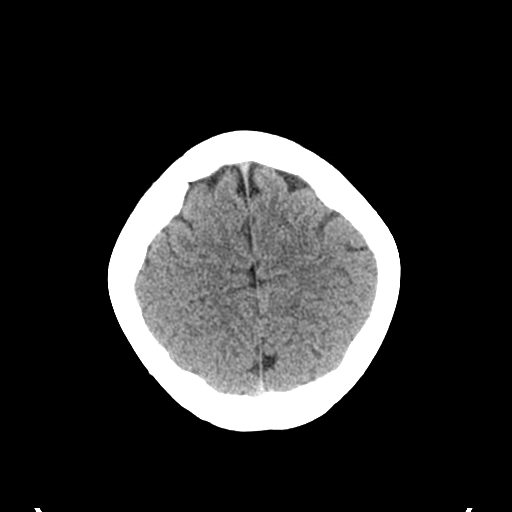
[im 23/28  brain]
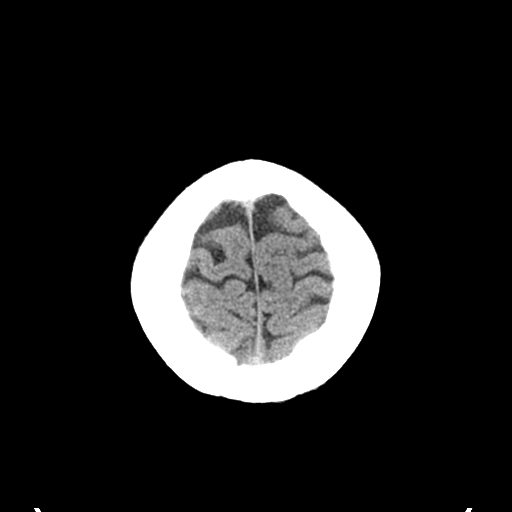
[im 26/28  brain]
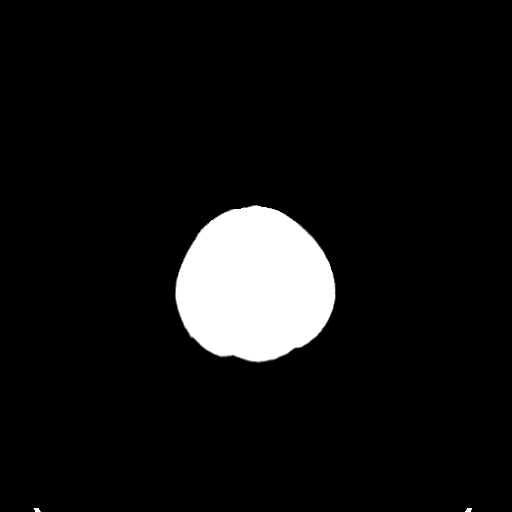
[im 26/28  bone]
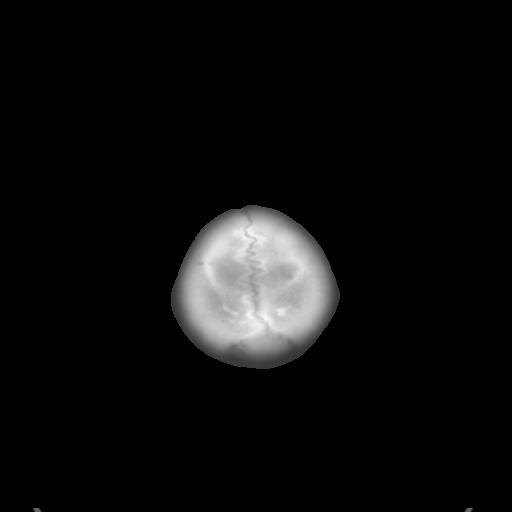

[Series 4: coronal soft tissue · coronal · 0.27mm/px · 3 of 63 slices shown]
[im 21/63  brain]
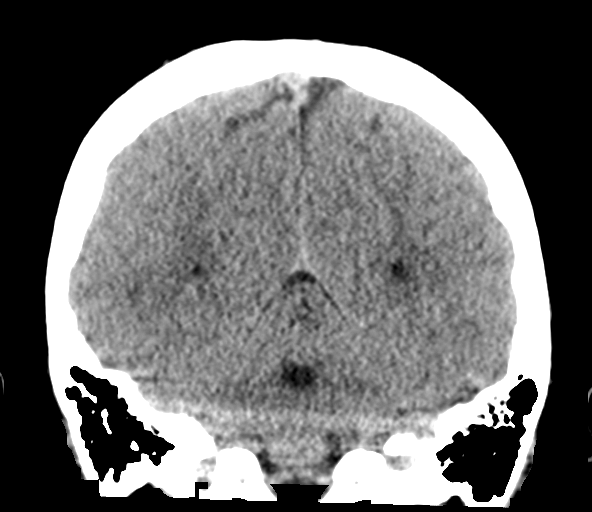
[im 28/63  brain]
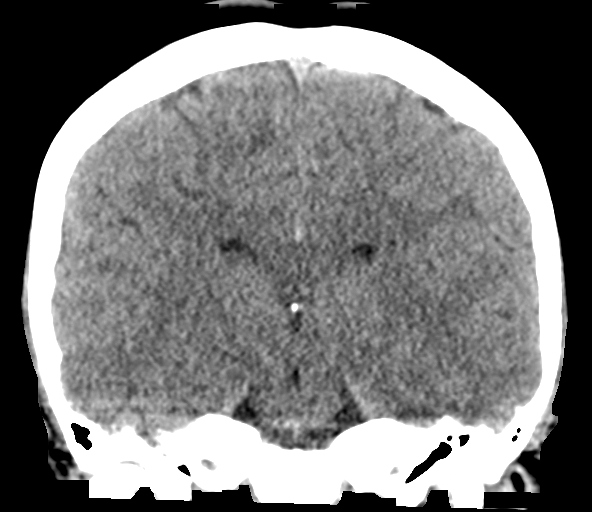
[im 35/63  brain]
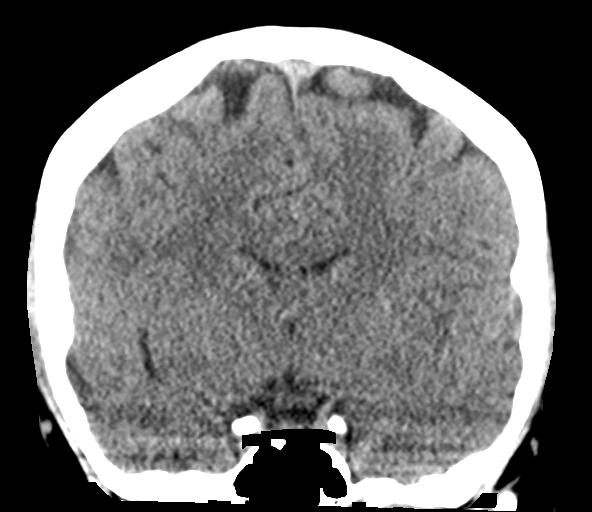

[Series 5: sagittal soft tissue · sagittal · 0.27mm/px · 3 of 55 slices shown]
[im 19/55  brain]
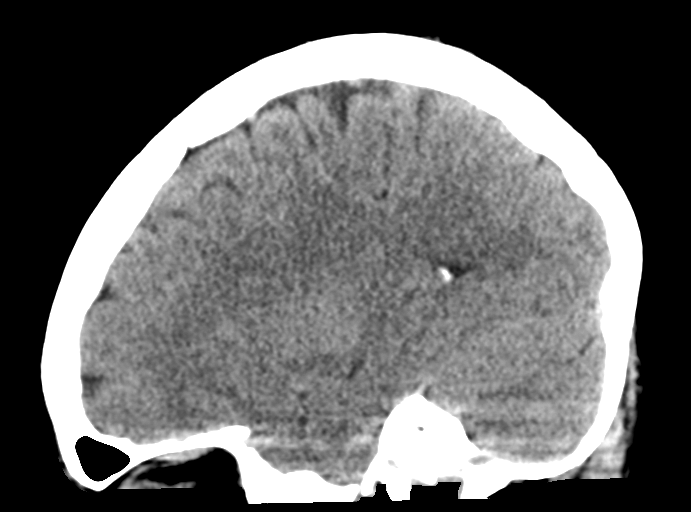
[im 28/55  brain]
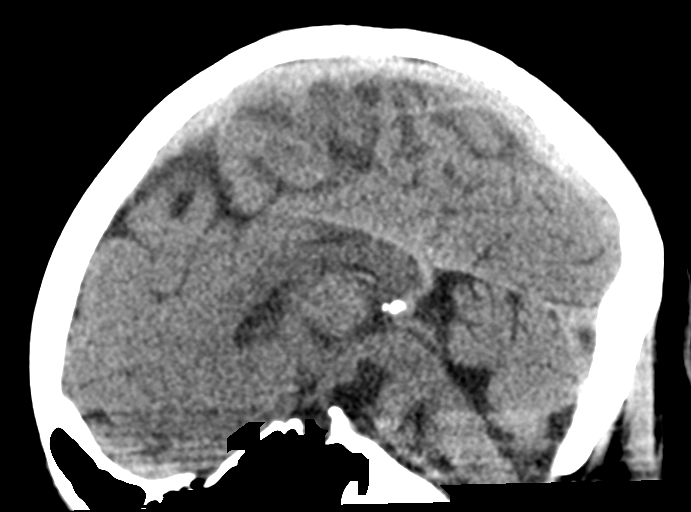
[im 37/55  brain]
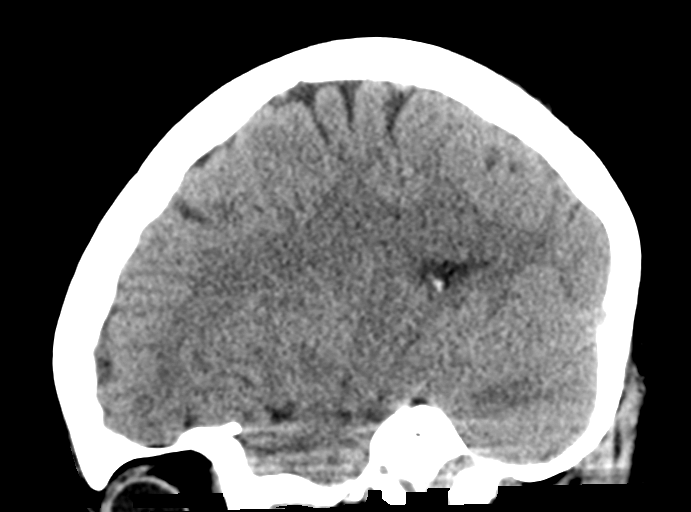

[15 of 45 positions shown; findings below may reference images not displayed]

FINDINGS: Brain: No evidence of acute infarction, hemorrhage, hydrocephalus,
extra-axial collection or mass lesion/mass effect.

Vascular: No hyperdense vessel or unexpected calcification.

Skull: Normal. Negative for fracture or focal lesion.

Sinuses/Orbits: No acute finding.

Other: None.
IMPRESSION: No acute intracranial abnormality.
